# Patient Record
Sex: Male | Born: 2005 | Race: Black or African American | Hispanic: No | Marital: Single | Smoking: Never smoker
Health system: Southern US, Community
[De-identification: ages and names within clinical notes are randomized; demographics above are authoritative.]

## PROBLEM LIST (undated history)

## (undated) DIAGNOSIS — J45909 Unspecified asthma, uncomplicated: Secondary | ICD-10-CM

---
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
## 2023-09-13 IMAGING — DX DG KNEE AP/LAT W/ SUNRISE*L*
3 series · 3 of 3 positions shown · non-contrast
Comparison: None.

CLINICAL DATA: Left knee pain creasing over the past week, initial
encounter

EXAM:
LEFT KNEE 3 VIEWS

[knee ap]
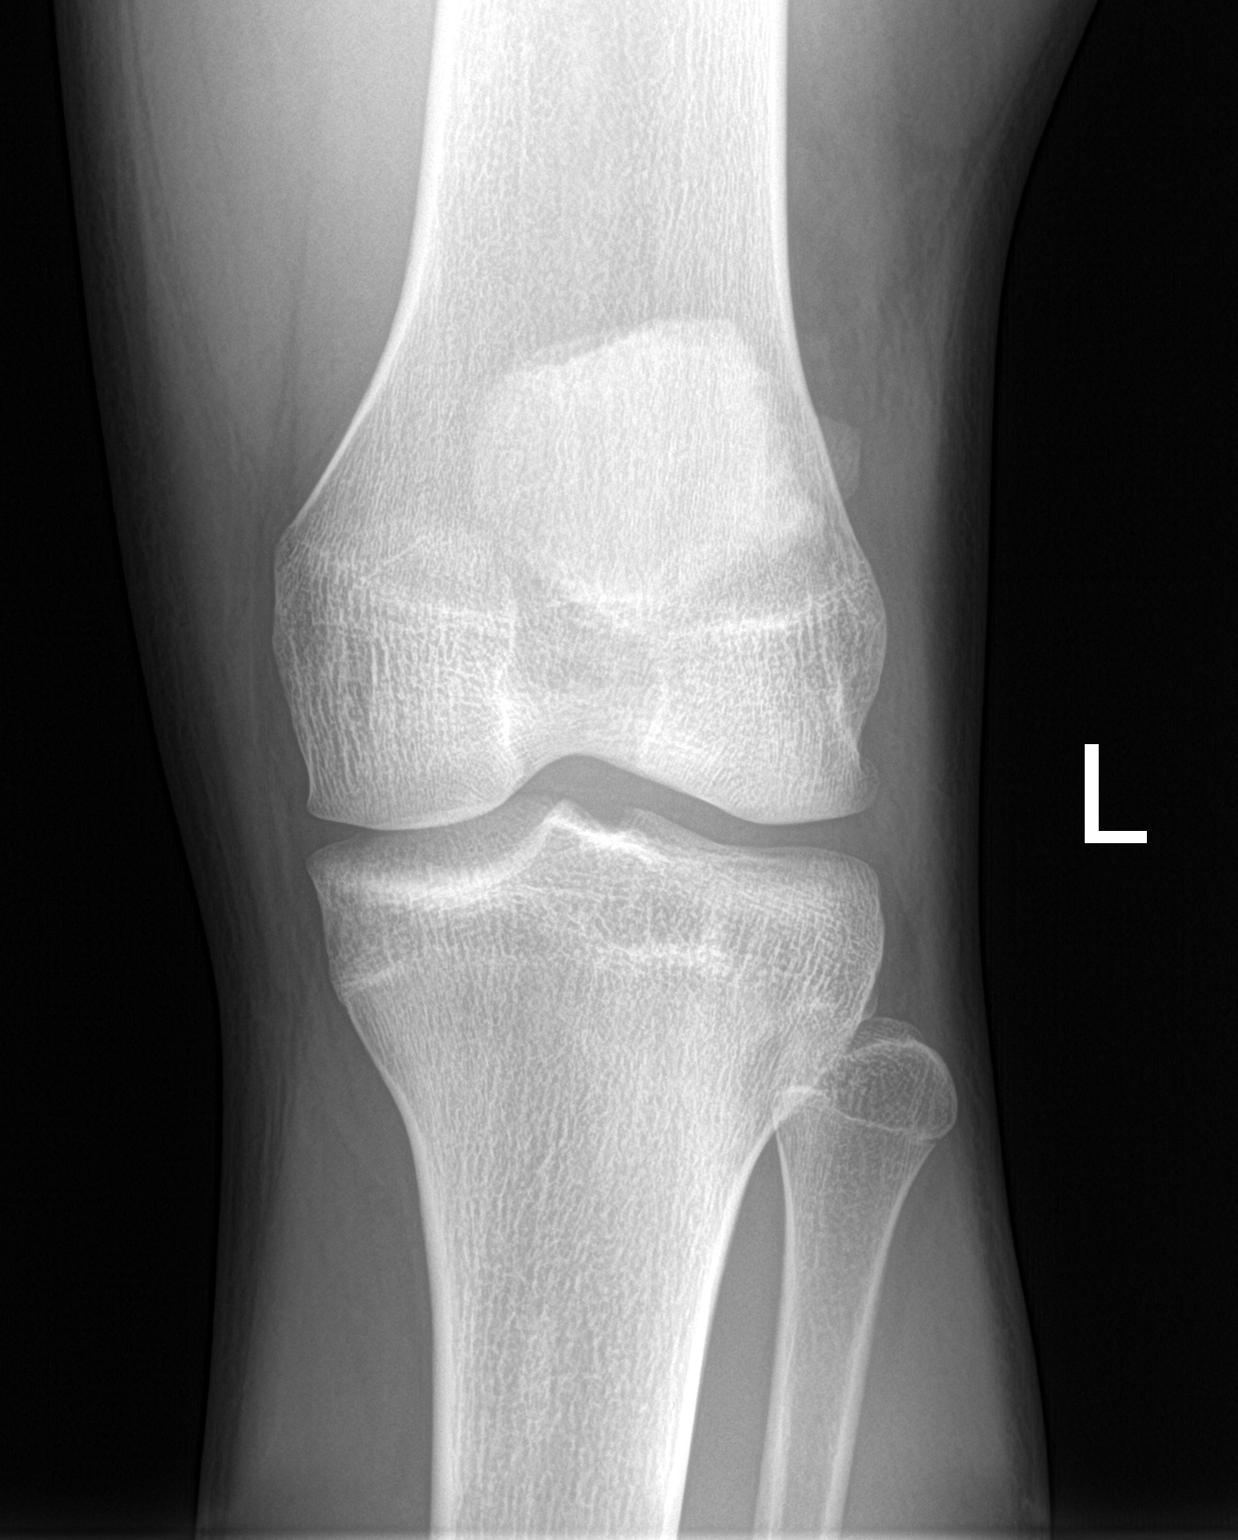

[knee lat]
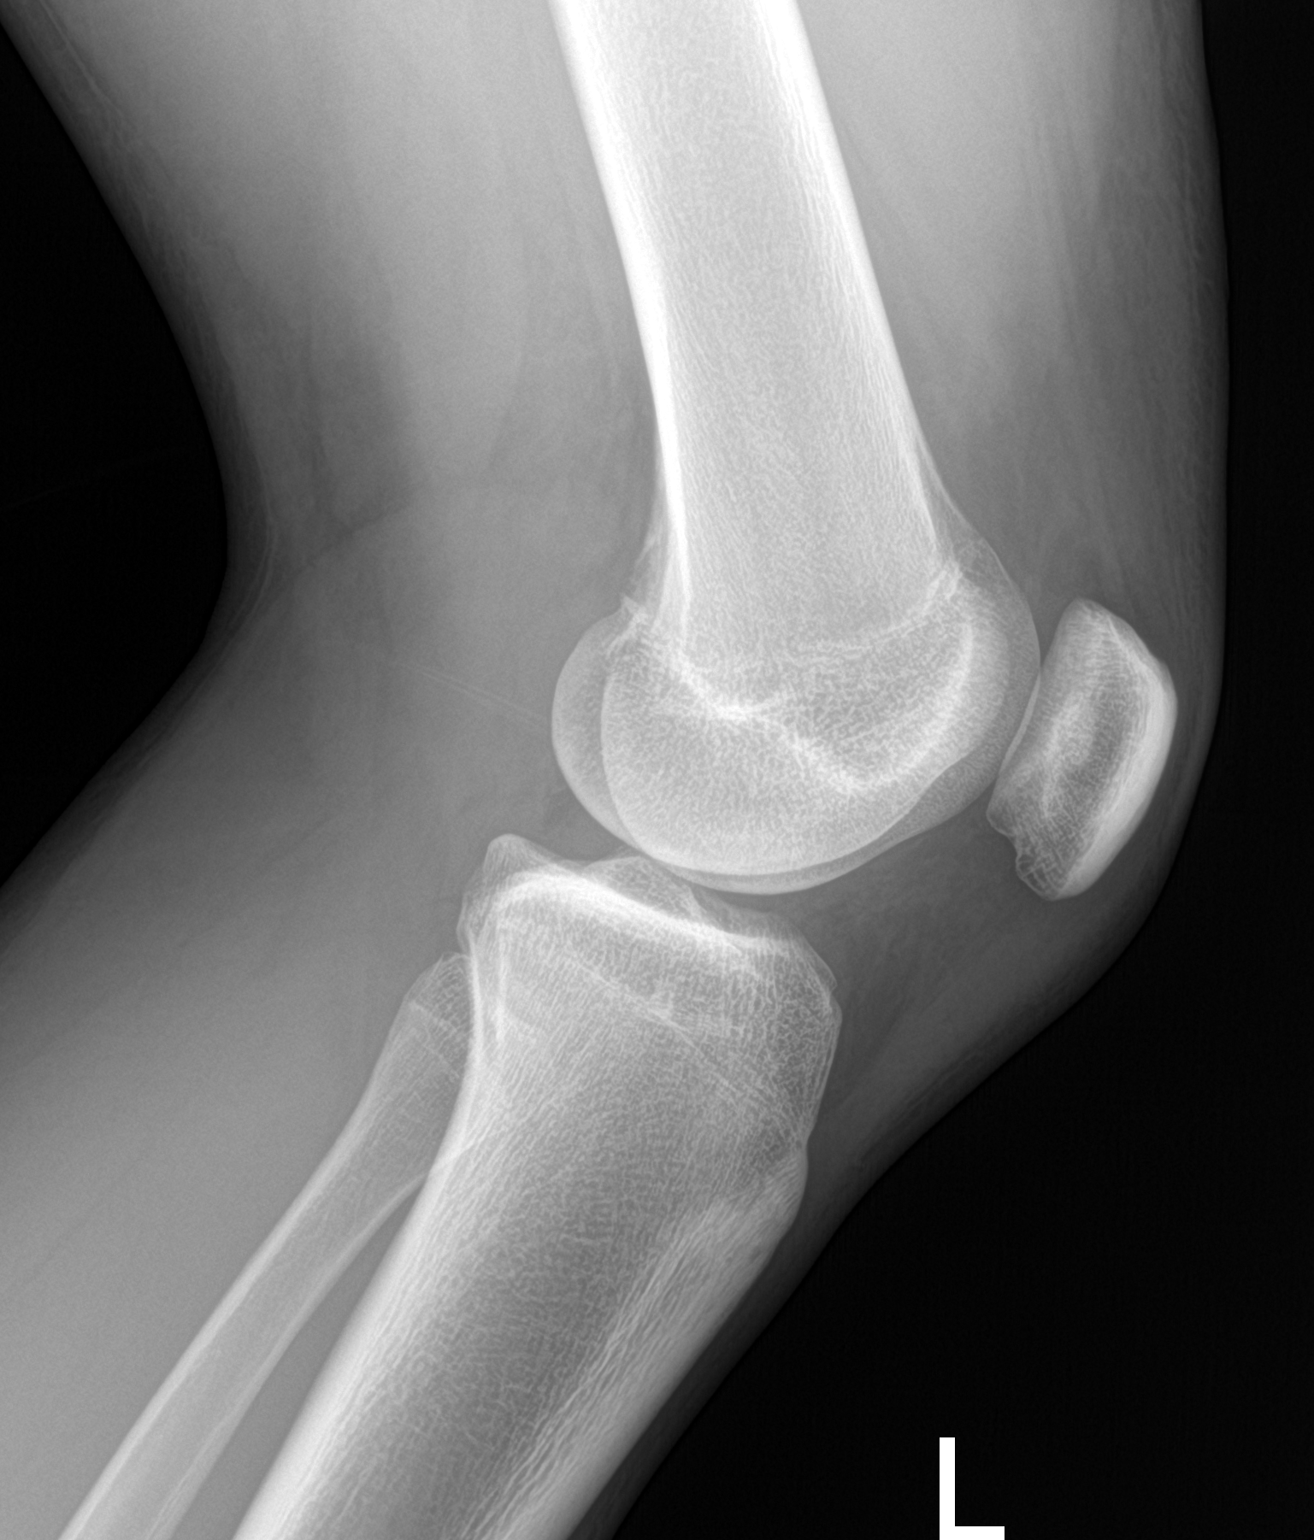

[knee sunrise]
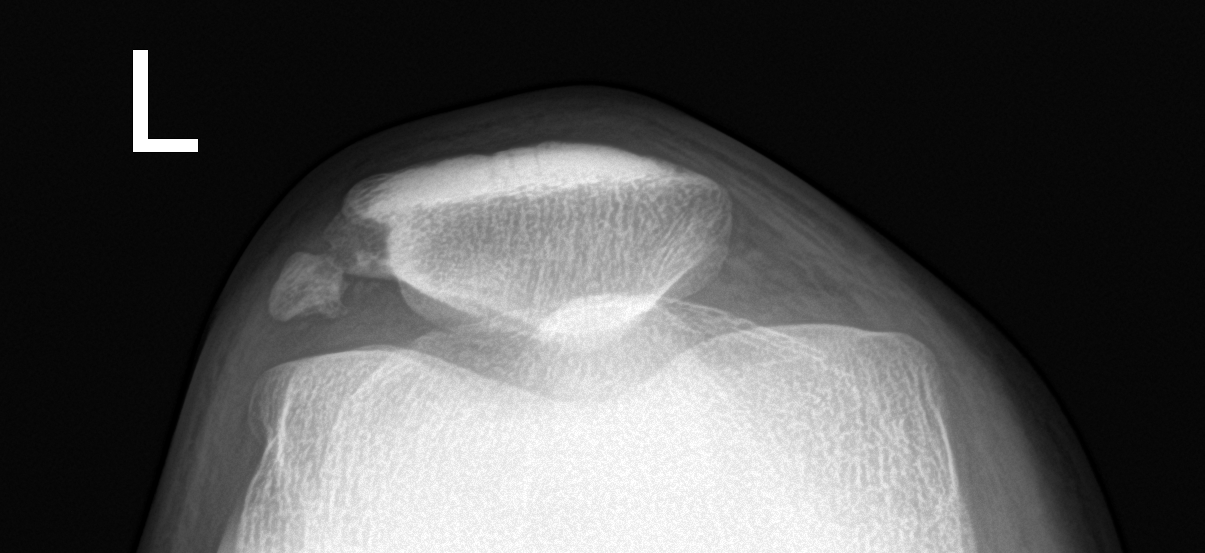

[3 of 3 positions shown; findings below may reference images not displayed]

FINDINGS: No acute fracture or dislocation is noted. Bipartite patella is
noted with separation of the smaller fragment. This may be related
to the patient's underlying pain.
IMPRESSION: Apparent bipartite patella with separation of the smaller fragment.
This may contribute to the patient's discomfort. No acute fractures
noted.

## 2023-09-30 ENCOUNTER — Encounter: Payer: Medicaid Other | Admitting: Family Medicine

## 2023-09-30 ENCOUNTER — Ambulatory Visit: Payer: Medicaid Other

## 2023-11-17 ENCOUNTER — Ambulatory Visit (HOSPITAL_COMMUNITY)
Admission: EM | Admit: 2023-11-17 | Discharge: 2023-11-18 | Disposition: A | Discharge status: Home / Self Care | Payer: Medicaid Other | Attending: Psychiatry | Admitting: Psychiatry

## 2023-11-17 DIAGNOSIS — Z62898 Other specified problems related to upbringing: Secondary | ICD-10-CM | POA: Insufficient documentation

## 2023-11-17 DIAGNOSIS — F4323 Adjustment disorder with mixed anxiety and depressed mood: Secondary | ICD-10-CM | POA: Diagnosis not present

## 2023-11-17 DIAGNOSIS — Z6332 Other absence of family member: Secondary | ICD-10-CM | POA: Insufficient documentation

## 2023-11-17 DIAGNOSIS — I498 Other specified cardiac arrhythmias: Secondary | ICD-10-CM | POA: Insufficient documentation

## 2023-11-17 LAB — CBC WITH DIFFERENTIAL/PLATELET
Abs Immature Granulocytes: 0.01 10*3/uL (ref 0.00–0.07)
Basophils Absolute: 0 10*3/uL (ref 0.0–0.1)
Basophils Relative: 1 %
Eosinophils Absolute: 0 10*3/uL (ref 0.0–0.5)
Eosinophils Relative: 1 %
HCT: 47.9 % (ref 39.0–52.0)
Hemoglobin: 17.2 g/dL — ABNORMAL HIGH (ref 13.0–17.0)
Immature Granulocytes: 0 %
Lymphocytes Relative: 40 %
Lymphs Abs: 1.8 10*3/uL (ref 0.7–4.0)
MCH: 29.5 pg (ref 26.0–34.0)
MCHC: 35.9 g/dL (ref 30.0–36.0)
MCV: 82 fL (ref 80.0–100.0)
Monocytes Absolute: 0.2 10*3/uL (ref 0.1–1.0)
Monocytes Relative: 6 %
Neutro Abs: 2.3 10*3/uL (ref 1.7–7.7)
Neutrophils Relative %: 52 %
Platelets: 297 10*3/uL (ref 150–400)
RBC: 5.84 MIL/uL — ABNORMAL HIGH (ref 4.22–5.81)
RDW: 12.9 % (ref 11.5–15.5)
WBC: 4.4 10*3/uL (ref 4.0–10.5)
nRBC: 0 % (ref 0.0–0.2)

## 2023-11-17 LAB — COMPREHENSIVE METABOLIC PANEL
ALT: 17 U/L (ref 0–44)
AST: 24 U/L (ref 15–41)
Albumin: 5 g/dL (ref 3.5–5.0)
Alkaline Phosphatase: 65 U/L (ref 38–126)
Anion gap: 9 (ref 5–15)
BUN: 17 mg/dL (ref 6–20)
CO2: 29 mmol/L (ref 22–32)
Calcium: 10.7 mg/dL — ABNORMAL HIGH (ref 8.9–10.3)
Chloride: 102 mmol/L (ref 98–111)
Creatinine, Ser: 1.19 mg/dL (ref 0.61–1.24)
GFR, Estimated: >60 mL/min (ref >60)
Glucose, Bld: 96 mg/dL (ref 70–99)
Potassium: 5 mmol/L (ref 3.5–5.1)
Sodium: 140 mmol/L (ref 135–145)
Total Bilirubin: 1.3 mg/dL — ABNORMAL HIGH (ref <1.2)
Total Protein: 8.5 g/dL — ABNORMAL HIGH (ref 6.5–8.1)

## 2023-11-17 LAB — POCT URINE DRUG SCREEN - MANUAL ENTRY (I-SCREEN)
POC Amphetamine UR: NOT DETECTED
POC Buprenorphine (BUP): NOT DETECTED
POC Cocaine UR: NOT DETECTED
POC Marijuana UR: POSITIVE — AB
POC Methadone UR: NOT DETECTED
POC Methamphetamine UR: NOT DETECTED
POC Morphine: NOT DETECTED
POC Oxazepam (BZO): NOT DETECTED
POC Oxycodone UR: NOT DETECTED
POC Secobarbital (BAR): NOT DETECTED

## 2023-11-17 LAB — HEMOGLOBIN A1C
Hgb A1c MFr Bld: 5.3 % (ref 4.8–5.6)
Mean Plasma Glucose: 105 mg/dL

## 2023-11-17 LAB — LIPID PANEL
Cholesterol: 194 mg/dL (ref 0–200)
HDL: 78 mg/dL (ref >40)
LDL Cholesterol: 107 mg/dL — ABNORMAL HIGH (ref 0–99)
Total CHOL/HDL Ratio: 2.5 {ratio}
Triglycerides: 44 mg/dL (ref <150)
VLDL: 9 mg/dL (ref 0–40)

## 2023-11-17 LAB — TSH: TSH: 1.755 u[IU]/mL (ref 0.350–4.500)

## 2023-11-17 LAB — ETHANOL: Alcohol, Ethyl (B): <10 mg/dL (ref <10)

## 2023-11-17 MED ORDER — ALUM & MAG HYDROXIDE-SIMETH 200-200-20 MG/5ML PO SUSP: 30.0000 mL | ORAL | Status: DC | PRN

## 2023-11-17 MED ORDER — HYDROXYZINE HCL 25 MG PO TABS
25.0000 mg | ORAL_TABLET | Freq: Three times a day (TID) | ORAL | Status: DC | PRN
  Administered 2023-11-17 (×2): 25 mg via ORAL
  Filled 2023-11-17 (×2): qty 1

## 2023-11-17 MED ORDER — MAGNESIUM HYDROXIDE 400 MG/5ML PO SUSP: 30.0000 mL | Freq: Every day | ORAL | Status: DC | PRN

## 2023-11-17 MED ORDER — ACETAMINOPHEN 325 MG PO TABS: 650.0000 mg | ORAL_TABLET | Freq: Four times a day (QID) | ORAL | Status: DC | PRN

## 2023-11-17 MED ORDER — TRAZODONE HCL 50 MG PO TABS
50.0000 mg | ORAL_TABLET | Freq: Every evening | ORAL | Status: DC | PRN
  Administered 2023-11-17: 50 mg via ORAL
  Filled 2023-11-17: qty 1
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
## 2023-11-17 NOTE — Discharge Instructions (Addendum)
Discharge recommendations:   Medications: Patient is to take medications as prescribed. The patient or patient's guardian is to contact a medical professional and/or outpatient provider to address any new side effects that develop. The patient or the patient's guardian should update outpatient providers of any new medications and/or medication changes.   Outpatient Follow up: Please review list of outpatient resources for psychiatry and counseling. Please follow up with your primary care provider for all medical related needs.   You are encouraged to follow up with [facility] for outpatient treatment.  Walk in/ Open Access Hours: Monday - Friday 8AM - 11AM (To see provider and therapist) Friday - 1PM - 4PM (To see therapist only)  [facility] [address] [place], [place] [phone]  Therapy: We recommend that patient participate in individual therapy to address mental health concerns.  Safety:   The following safety precautions should be taken:   No sharp objects. This includes scissors, razors, scrapers, and putty knives.   Chemicals should be removed and locked up.   Medications should be removed and locked up.   Weapons should be removed and locked up. This includes firearms, knives and instruments that can be used to cause injury.   The patient should abstain from use of illicit substances/drugs and abuse of any medications.  If symptoms worsen or do not continue to improve or if the patient becomes actively suicidal or homicidal then it is recommended that the patient return to the closest hospital emergency department, the [facility], or call 911 for further evaluation and treatment. National Suicide Prevention Lifeline 1-800-SUICIDE or 1-800-273-8255.  About 988 988 offers 24/7 access to trained crisis counselors who can help people experiencing mental health-related distress. People can call or  text 988 or chat 988lifeline.org for themselves or if they are worried about a loved one who may need crisis support.     

## 2023-11-17 NOTE — ED Notes (Signed)
Patient observed/assessed in bed/chair resting quietly appearing in no distress and verbalizing no complaints at this time. Will continue to monitor.  

## 2023-11-17 NOTE — Progress Notes (Signed)
   11/17/23 0944  [facility] Triage Screening (Walk-ins at [facility] only)  How Did You Hear About [place]? Self  What Is the Reason for Your Visit/Call Today? Pt presents to [facility] voluntarily unaccompanied. Pt states that he recently was in a relationship and they decided to take a break. Pt states he has been dealing with a lot of past issues and he noticed that his love for basketball is starting to fade. Pt states that he lives with his grandmother, and doesn't really have anyone because his father is incarcerated and he doesn't know where his mother is at.  How Long Has This Been Causing You Problems? > than 6 months  Have You Recently Had Any Thoughts About Hurting Yourself? Yes  How long ago did you have thoughts about hurting yourself? yesterday - no plan  Are You Planning to Commit Suicide/Harm Yourself At This time? No  Have you Recently Had Thoughts About Hurting Someone [name]? No  Are You Planning To Harm Someone At This Time? No  Physical Abuse Denies  Verbal Abuse Denies  Sexual Abuse Denies  Exploitation of patient/patient's resources Denies  Self-Neglect Denies  Are you currently experiencing any auditory, visual or other hallucinations? No  Have You Used Any Alcohol or Drugs in the Past 24 Hours? Yes  How long ago did you use Drugs or Alcohol? yesterday  What Did You Use and How Much? THC pen  Do you have any current medical co-morbidities that require immediate attention? No  Clinician description of patient physical appearance/behavior: casually dressed, calm, cooperative, soft spoken  What Do You Feel Would Help You the Most Today? Social Support;Treatment for Depression or other mood problem;Medication(s)  If access to [facility] Urgent Care was not available, would you have sought care in the Emergency Department? No  Determination of Need Routine (7 days)  Options For Referral Medication Management;Intensive Outpatient Therapy;Outpatient Therapy

## 2023-11-17 NOTE — ED Provider Notes (Signed)
[facility] Continuous Assessment Admission H&P  Date: 11/17/23 Patient Name: Jesse Hart MRN: [id number] Chief Complaint: Depressed mood after break up.   Diagnoses:  Final diagnoses:  Adjustment disorder with mixed anxiety and depressed mood    HPI: Jesse Hart 17 yr old male pt with no past psychiatric history who presented to [facility] for depressed mood and anxiety with passive SI after a break up with girlfriend of almost 2 years. He reports "I have a lot going on mentally. Things that have happened in my life that I never talked about, have caused a lot of pain and anger. I think I need some help now." Pt complains of loss of interest in activities such as basketball, decreased appetite and sleep, sadness, isolation, hopelessness and crying. These symptoms have worsened over the past 1-2 months and started when he was in the 8th grade.  He has not received any any prior outpatient mental health services. He reports having trouble opening up to others about his past and feels that others do not understand. He reports past trauma of witnessing his grandfather get tazed,  grandparents fighting a lot, and father being involved in gang violence then getting incarcerated. His mother and father have not been present in his life since he was a young child. He reports that thinking about his past caused a lot of anxiety yesterday and endorses using a THC pen for the first time to calm himself down. Denies homicidal ideations and hallucinations. Denies history of suicide attempts or self injurious behaviors.He does not feel that grandmother is supportive of him getting mental health treatment and states "she thinks I am doing too much". Pt denies active SI today but endorses passive SI without plan or intent yesterday stating "I think it would be better if I was not around".   Pt is unable to contract for safety initially when discussing option of inpatient treatment but then stated that he is too  scared and does not want to harm himself. He also brings up the option of staying with a friend instead of going back to his grandmother's house because he is easily triggered there. He states that he can keep himself safe when staying with his friend. His support system includes his ex-girlfriend and three close friends- Jean Rosenthal, [place] and [place].    Total Time spent with patient: 45 minutes  Musculoskeletal  Strength & Muscle Tone: within normal limits Gait & Station: normal Patient leans: N/A  Psychiatric Specialty Exam  Presentation General Appearance:  Appropriate for Environment  Eye Contact: Fair  Speech: Normal Rate  Speech Volume: Normal  Handedness: Right   Mood and Affect  Mood: Depressed; Hopeless  Affect: Depressed   Thought Process  Thought Processes: Coherent  Descriptions of Associations:Intact  Orientation:Full (Time, Place and Person)  Thought Content:WDL    Hallucinations:Hallucinations: None  Ideas of Reference:None  Suicidal Thoughts:Suicidal Thoughts: Yes, Passive SI Passive Intent and/or Plan: Without Intent; Without Plan  Homicidal Thoughts:Homicidal Thoughts: No   Sensorium  Memory: Immediate Good; Remote Good; Recent Good  Judgment: Fair  Insight: Fair   Art therapist  Concentration: Fair  Attention Span: Good  Recall: Good  Fund of Knowledge: Good  Language: Good   Psychomotor Activity  Psychomotor Activity: Psychomotor Activity: Normal   Assets  Assets: Desire for Improvement; Social Support; Housing; Talents/Skills; Physical Health; Vocational/Educational   Sleep  Sleep: Sleep: Fair Number of Hours of Sleep: 6   Nutritional Assessment (For OBS and FBC admissions only)  Has the patient had a weight loss or gain of 10 pounds or more in the last 3 months?: No Has the patient had a decrease in food intake/or appetite?: Yes Does the patient have dental problems?: No Does the patient  have eating habits or behaviors that may be indicators of an eating disorder including binging or inducing vomiting?: No Has the patient recently lost weight without trying?: 0 Has the patient been eating poorly because of a decreased appetite?: 1 Malnutrition Screening Tool Score: 1    Physical Exam Constitutional:      Appearance: Normal appearance.  HENT:     Head: Normocephalic.     Nose: Nose normal.  Eyes:     Pupils: Pupils are equal, round, and reactive to light.  Cardiovascular:     Rate and Rhythm: Normal rate.  Pulmonary:     Effort: Pulmonary effort is normal.  Musculoskeletal:        General: Normal range of motion.     Cervical back: Normal range of motion.  Neurological:     Mental Status: He is alert and oriented to person, place, and time.    Review of Systems  Constitutional: Negative.   HENT: Negative.    Eyes: Negative.   Respiratory: Negative.    Cardiovascular: Negative.   Gastrointestinal: Negative.   Genitourinary: Negative.   Musculoskeletal: Negative.   Neurological: Negative.   Endo/Heme/Allergies: Negative.   Psychiatric/Behavioral:  Positive for depression. The patient is nervous/anxious.     Blood pressure (!) 149/89, pulse 70, temperature 98.4 F (36.9 C), temperature source Oral, resp. rate 16, SpO2 100%. There is no height or weight on file to calculate BMI.  Past Psychiatric History: No psychiatric history.    Is the patient at risk to self? Yes  Has the patient been a risk to self in the past 6 months? No .    Has the patient been a risk to self within the distant past? No   Is the patient a risk to others? No   Has the patient been a risk to others in the past 6 months? No   Has the patient been a risk to others within the distant past? No   Past Medical History: None  Family History: No family history reported   Social History: Pt [place] currently in the 12th grade at [employer] He states that he is graduating  next year with plans to go to college. Currently living with grandmother. He used a THC pen yesterday for the first time.   Last Labs:  Admission on 11/17/2023  Component Date Value Ref Range Status   POC Amphetamine UR 11/17/2023 None Detected  NONE DETECTED (Cut Off Level 1000 ng/mL) Final   POC Secobarbital (BAR) 11/17/2023 None Detected  NONE DETECTED (Cut Off Level 300 ng/mL) Final   POC Buprenorphine (BUP) 11/17/2023 None Detected  NONE DETECTED (Cut Off Level 10 ng/mL) Final   POC Oxazepam (BZO) 11/17/2023 None Detected  NONE DETECTED (Cut Off Level 300 ng/mL) Final   POC Cocaine UR 11/17/2023 None Detected  NONE DETECTED (Cut Off Level 300 ng/mL) Final   POC Methamphetamine UR 11/17/2023 None Detected  NONE DETECTED (Cut Off Level 1000 ng/mL) Final   POC Morphine 11/17/2023 None Detected  NONE DETECTED (Cut Off Level 300 ng/mL) Final   POC Methadone UR 11/17/2023 None Detected  NONE DETECTED (Cut Off Level 300 ng/mL) Final   POC Oxycodone UR 11/17/2023 None Detected  NONE DETECTED (Cut Off Level 100  ng/mL) Final   POC Marijuana UR 11/17/2023 Positive (A)  NONE DETECTED (Cut Off Level 50 ng/mL) Final    Allergies: Patient has no known allergies.  Medications:  Facility Ordered Medications  Medication   acetaminophen (TYLENOL) tablet 650 mg   alum & mag hydroxide-simeth (MAALOX/MYLANTA) 200-200-20 MG/5ML suspension 30 mL   magnesium hydroxide (MILK OF MAGNESIA) suspension 30 mL   hydrOXYzine (ATARAX) tablet 25 mg   traZODone (DESYREL) tablet 50 mg   PTA Medications  Medication Sig   albuterol (VENTOLIN HFA) 108 (90 Base) MCG/ACT inhaler Inhale 2 puffs into the lungs every 6 (six) hours as needed for wheezing or shortness of breath.      Medical Decision Making  Pt admitted to [facility] continued assessment for overnight observation for safety and mood stabilization.    Lab Orders         CBC with Differential/Platelet         Comprehensive metabolic panel          Hemoglobin A1c         Ethanol         Lipid panel         TSH         POCT Urine Drug Screen - (I-Screen)    EKG  Meds ordered this encounter  Medications   acetaminophen (TYLENOL) tablet 650 mg   alum & mag hydroxide-simeth (MAALOX/MYLANTA) 200-200-20 MG/5ML suspension 30 mL   magnesium hydroxide (MILK OF MAGNESIA) suspension 30 mL   hydrOXYzine (ATARAX) tablet 25 mg   traZODone (DESYREL) tablet 50 mg       Recommendations  Based on my evaluation the patient does not appear to have an emergency medical condition.  Howie Ill, NP 11/17/23  11:43 AM

## 2023-11-17 NOTE — ED Notes (Signed)
Patient is alert and oriented X 4. He is calm and cooperative. He denies current SI/HI or AVH. He states yesterday he did have thoughts of harming himself but with no specific plan or intent. Patient contracted for safety. Skin check conducted by this nurse and [facility], MHT. Pt oriented to the unit and offered food and beverage. He denies physical pain or discomfort. No additional needs noted at this time. We will continue to monitor for safety.

## 2023-11-18 MED ORDER — HYDROXYZINE HCL 25 MG PO TABS: 25.0000 mg | ORAL_TABLET | Freq: Three times a day (TID) | ORAL | 0 refills | Status: DC | PRN

## 2023-11-18 MED ORDER — TRAZODONE HCL 50 MG PO TABS: 50.0000 mg | ORAL_TABLET | Freq: Every evening | ORAL | 0 refills | Status: DC | PRN

## 2023-11-18 NOTE — ED Notes (Signed)

## 2023-11-18 NOTE — ED Notes (Signed)
Patient discharged in no acute distress. Pt denies SI/HI or AVH at the time of discharge. Patient's belongings returned from locker # 27. Patient escorted to the front lobby by staff for transport home with grandparents. AVS given and reviewed with patient and grandparents. Understanding voiced. Safety maintained.

## 2023-11-18 NOTE — ED Notes (Signed)
Pt sleeping at present, respirations even & unlabored, no distress noted.  Monitoring for safety.

## 2023-11-18 NOTE — ED Provider Notes (Signed)
FBC/OBS ASAP Discharge Summary  Date and Time: 11/18/2023 8:21 AM  Name: Jesse Hart  MRN:  [id number]   Discharge Diagnoses:  Final diagnoses:  Adjustment disorder with mixed anxiety and depressed mood    Subjective: Jesse Hart is a 17 year old male that presented for passive SI in the context of recent break up with girlfriend. Upon assessment today, he is awake, alert and oriented x3 , and seen talking on the phone with ex-girlfriend in milieu. He is calm and cooperative with euthymic mood. He denies current suicidal ideations, homicidal ideations and hallucinations. He denies feeling anxious and reports sleeping well last night. He states that the Trazodone and Hydroxyzine were effective for sleep and anxiety. Denies any side effects or concerns with these medications. Reports feeling "much better just getting away from everything for the night and I am actually excited to get back to playing ball and starting therapy". He reports speaking with his grandmother yesterday and feels that she was understanding and supportive of him wanting mental health treatment. He also spoke with ex-girlfriend who is also in support. He is able to contract for safety stating "if I get overwhelmed or stressed, I can call my old basketball coach Abner Greenspan or reach out to my teacher Ms. Alexander who has helped me talk through things before". He denies any access to weapons at home. We discussed plan to discharge and follow up with psychiatry and therapy through [facility]. Pt verbalizes understanding. Discussed plan of care with Dr. Enedina Finner.   Consent was given by pt to speak with grandparents for safety planning. This provider spoke with Grandmother Sherrin Daisy) and Grandfather Norva Riffle) in the triage room. They were obviously concerned about his mental health and very supportive of him getting treatment. Safety planning was discussed- no weapons in the home and monitor or lock up his medications. They  denied any safety concerns at this time.   Stay Summary: Jesse Hart 17 yr old male pt with no past psychiatric history who presented to [facility] 11/17/23 for depressed mood and anxiety with passive SI after a break up with girlfriend of almost 2 years. Pt complains of loss of interest in activities, decreased appetite and sleep, sadness, isolation, hopelessness and crying. These symptoms have worsened over the past 1-2 months and started when he was in the 8th grade.  Pt denied active SI yesterday but endorsed passive SI without plan or intent yesterday stating "I think it would be better if I was not around". He has not received any any prior outpatient mental health services. He was admitted to [facility] continuous assessment for stabilization and monitoring.   Total Time spent with patient: 30 minutes  Past Psychiatric History: No past psychiatric history reported.  Past Medical History: No medical history reported.  Family History: No family medical history reported.  Family Psychiatric History: No family psychiatric history reported.  Social History: Pt is currently living with grandmother and is in 12th grade at [employer] where he also plays basketball. Reports using a THC pen 2 days ago for the first time but does not plan to use it again.  Tobacco Cessation:  N/A, patient does not currently use tobacco products  Current Medications:  Current Facility-Administered Medications  Medication Dose Route Frequency Provider Last Rate Last Admin   acetaminophen (TYLENOL) tablet 650 mg  650 mg Oral Q6H PRN Howie Ill, NP       alum & mag hydroxide-simeth (MAALOX/MYLANTA) 200-200-20 MG/5ML suspension  30 mL  30 mL Oral Q4H PRN Howie Ill, NP       hydrOXYzine (ATARAX) tablet 25 mg  25 mg Oral TID PRN Howie Ill, NP   25 mg at 11/17/23 2110   magnesium hydroxide (MILK OF MAGNESIA) suspension 30 mL  30 mL Oral Daily PRN Howie Ill, NP       traZODone (DESYREL)  tablet 50 mg  50 mg Oral QHS PRN Howie Ill, NP   50 mg at 11/17/23 2111   Current Outpatient Medications  Medication Sig Dispense Refill   albuterol (VENTOLIN HFA) 108 (90 Base) MCG/ACT inhaler Inhale 2 puffs into the lungs every 6 (six) hours as needed for wheezing or shortness of breath. 6.7 g 0   ASHWAGANDHA PO Take 2 tablets by mouth daily.      PTA Medications:  Facility Ordered Medications  Medication   acetaminophen (TYLENOL) tablet 650 mg   alum & mag hydroxide-simeth (MAALOX/MYLANTA) 200-200-20 MG/5ML suspension 30 mL   magnesium hydroxide (MILK OF MAGNESIA) suspension 30 mL   hydrOXYzine (ATARAX) tablet 25 mg   traZODone (DESYREL) tablet 50 mg   PTA Medications  Medication Sig   albuterol (VENTOLIN HFA) 108 (90 Base) MCG/ACT inhaler Inhale 2 puffs into the lungs every 6 (six) hours as needed for wheezing or shortness of breath.       09/16/2021   10:43 AM 12/03/2019    9:20 AM 09/04/2019    9:43 AM  Depression screen PHQ 2/9  Decreased Interest 0 0 0  Down, Depressed, Hopeless 0 0 0  PHQ - 2 Score 0 0 0  Altered sleeping 0    Tired, decreased energy 0    Change in appetite 0    Feeling bad or failure about yourself  0    Trouble concentrating 0    Moving slowly or fidgety/restless 0    Suicidal thoughts 0    PHQ-9 Score 0    Difficult doing work/chores Not difficult at all      Flowsheet Row ED from 11/17/2023 in [facility] ED from 11/11/2022 in [facility] at [facility] from 09/14/2022 in [facility] at [facility] RISK CATEGORY Low Risk No Risk No Risk       Musculoskeletal  Strength & Muscle Tone: within normal limits Gait & Station: normal Patient leans: N/A  Psychiatric Specialty Exam  Presentation  General Appearance:  Appropriate for Environment  Eye Contact: Good  Speech: Clear and Coherent; Normal Rate  Speech Volume: Normal  Handedness: Right   Mood and  Affect  Mood: Euthymic  Affect: Appropriate   Thought Process  Thought Processes: Coherent  Descriptions of Associations:Intact  Orientation:Full (Time, Place and Person)  Thought Content:WDL  Diagnosis of Schizophrenia or Schizoaffective disorder in past: No    Hallucinations:Hallucinations: None  Ideas of Reference:None  Suicidal Thoughts: Denies current SI.  Homicidal Thoughts:Homicidal Thoughts: No   Sensorium  Memory: Immediate Good; Recent Good; Remote Good  Judgment: Good  Insight: Good   Executive Functions  Concentration: Good  Attention Span: Good  Recall: Good  Fund of Knowledge: Good  Language: Good   Psychomotor Activity  Psychomotor Activity: Psychomotor Activity: Normal   Assets  Assets: Desire for Improvement; Housing; Physical Health; Social Support; Talents/Skills; Vocational/Educational   Sleep  Sleep: Sleep: Good Number of Hours of Sleep: 8   Nutritional Assessment (For OBS and FBC admissions only) Has the patient had a weight  loss or gain of 10 pounds or more in the last 3 months?: No Has the patient had a decrease in food intake/or appetite?: Yes Does the patient have dental problems?: No Does the patient have eating habits or behaviors that may be indicators of an eating disorder including binging or inducing vomiting?: No Has the patient recently lost weight without trying?: 0 Has the patient been eating poorly because of a decreased appetite?: 1 Malnutrition Screening Tool Score: 1    Physical Exam  Physical Exam Vitals reviewed.  Constitutional:      Appearance: Normal appearance.  HENT:     Head: Normocephalic.     Nose: Nose normal.  Eyes:     Pupils: Pupils are equal, round, and reactive to light.  Cardiovascular:     Rate and Rhythm: Normal rate.  Pulmonary:     Effort: Pulmonary effort is normal.  Musculoskeletal:        General: Normal range of motion.     Cervical back: Normal range of  motion.  Skin:    General: Skin is warm.  Neurological:     General: No focal deficit present.     Mental Status: He is alert.    Review of Systems  Constitutional: Negative.   HENT: Negative.    Eyes: Negative.   Respiratory: Negative.    Cardiovascular: Negative.   Gastrointestinal: Negative.   Genitourinary: Negative.   Musculoskeletal: Negative.   Neurological: Negative.   Endo/Heme/Allergies: Negative.   Psychiatric/Behavioral:  Positive for depression.    Blood pressure 137/77, pulse 60, temperature 98 F (36.7 C), temperature source Oral, resp. rate 18, SpO2 100%. There is no height or weight on file to calculate BMI.  Demographic Factors:  Male and Adolescent or young adult  Loss Factors: NA  Historical Factors: NA  Risk Reduction Factors:   Sense of responsibility to family, Religious beliefs about death, Living with another person, especially a relative, Positive social support, Positive therapeutic relationship, and Positive coping skills or problem solving skills  Continued Clinical Symptoms:  Depression:   Hopelessness  Cognitive Features That Contribute To Risk:  None    Suicide Risk:  Minimal: No identifiable suicidal ideation.  Patients presenting with no risk factors but with morbid ruminations; may be classified as minimal risk based on the severity of the depressive symptoms  Plan Of Care/Follow-up recommendations:  Medications: Patient is to take medications as prescribed. The patient or patient's guardian is to contact a medical professional and/or outpatient provider to address any new side effects that develop. The patient or the patient's guardian should update outpatient providers of any new medications and/or medication changes.   Will E-Prescribe Vistaril 25mg  TID PRN for anxiety x 14 tablets.  Will E-Prescribe Trazodone 50mg  at bedtime PRN for sleep x 14 tablets.    Outpatient Follow up: Please review list of outpatient resources for  psychiatry and counseling. Please follow up with your primary care provider for all medical related needs.  You are encouraged to follow up with [facility] for outpatient treatment.  Walk in/ Open [facility] Hours: Monday - Friday 8AM - 11AM (To see provider and therapist) Friday - 1PM - 4PM (To see therapist only)  [facility] [address] [place], [place] [phone]  Therapy: We recommend that patient participate in individual therapy to address mental health concerns.   Safety:   The following safety precautions should be taken:   No sharp objects. This includes scissors, razors, scrapers, and putty  knives.   Chemicals should be removed and locked up.   Medications should be removed and locked up.   Weapons should be removed and locked up. This includes firearms, knives and instruments that can be used to cause injury.   The patient should abstain from use of illicit substances/drugs and abuse of any medications.  If symptoms worsen or do not continue to improve or if the patient becomes actively suicidal or homicidal then it is recommended that the patient return to the closest hospital emergency department, the [facility], or call 911 for further evaluation and treatment. National Suicide Prevention Lifeline 1-800-SUICIDE or (678) 198-3185.  About 988 988 offers 24/7 access to trained crisis counselors who can help people experiencing mental health-related distress. People can call or text 988 or chat 988lifeline.org for themselves or if they are worried about a loved one who may need crisis support.     Disposition: Pt will discharge home and follow up with [facility] outpatient services. Discussed plan of care with Dr. Enedina Finner.  Howie Ill, NP 11/18/2023, 8:21 AM

## 2023-11-30 ENCOUNTER — Encounter (HOSPITAL_COMMUNITY): Payer: Self-pay

## 2023-11-30 ENCOUNTER — Ambulatory Visit (HOSPITAL_COMMUNITY)
Admission: EM | Admit: 2023-11-30 | Discharge: 2023-11-30 | Disposition: A | Discharge status: Home / Self Care | Payer: Medicaid Other | Attending: Emergency Medicine | Admitting: Emergency Medicine

## 2023-11-30 DIAGNOSIS — S39012A Strain of muscle, fascia and tendon of lower back, initial encounter: Secondary | ICD-10-CM

## 2023-11-30 MED ORDER — TRIAMCINOLONE ACETONIDE 40 MG/ML IJ SUSP
40.0000 mg | Freq: Once | INTRAMUSCULAR | Status: AC
  Administered 2023-11-30: 40 mg via INTRAMUSCULAR

## 2023-11-30 MED ORDER — TRIAMCINOLONE ACETONIDE 40 MG/ML IJ SUSP
INTRAMUSCULAR | Status: AC
  Filled 2023-11-30: qty 1

## 2023-11-30 NOTE — Discharge Instructions (Signed)
 Your exam is consistent with a lumbar strain.  Please rest your muscles and avoid heavy lifting or sports until next week.  If your back pain persists despite rest, steroid injection and anti-inflammatories please follow-up with your orthopedic team.  Return to clinic for any new or urgent symptoms.

## 2023-11-30 NOTE — ED Provider Notes (Signed)
 [facility]    CSN: [id number] Arrival date & time: 11/30/23  1115      History   Chief Complaint Chief Complaint  Patient presents with   Back Pain    HPI Jesse Hart is a 18 y.o. male.   Patient reports back pain that started during a basketball game on December 27.  He was able to continue playing.  Practice the next day he had a lot of pain and had to get stretched out.  He had 4 days off and tried to go back to practice but his lower back continues to hurt.   He has been resting, using ibuprofen  and stretching with minimal relief.  Has bilateral lower back pain that is worse on his left side and moves into his left glute.  Denies any trauma or falls.  Unable to pinpoint an injury that caused his back pain.  No dysuria.   Pain increases with bending and twisting.  The history is provided by the patient and medical records.  Back Pain   Past Medical History:  Diagnosis Date   Asthma     Patient Active Problem List   Diagnosis Date Noted   Grade 3 ankle sprain, left, initial encounter 12/04/2019   Eczema 01/19/2007    History reviewed. No pertinent surgical history.     Home Medications    Prior to Admission medications   Medication Sig Start Date End Date Taking? Authorizing Provider  albuterol  (VENTOLIN  HFA) 108 (90 Base) MCG/ACT inhaler Inhale 2 puffs into the lungs every 6 (six) hours as needed for wheezing or shortness of breath. 09/14/22   Wedderburn, Ngozi N, NP  hydrOXYzine  (ATARAX ) 25 MG tablet Take 1 tablet (25 mg total) by mouth 3 (three) times daily as needed for anxiety. 11/18/23   Brent, Amanda C, NP  traZODone  (DESYREL ) 50 MG tablet Take 1 tablet (50 mg total) by mouth at bedtime as needed for sleep. 11/18/23   Thresa Alan BROCKS, NP    Family History History reviewed. No pertinent family history.  Social History Social History   Tobacco Use   Smoking status: Never    Passive exposure: Yes   Smokeless tobacco: Never  Vaping  Use   Vaping status: Never Used     Allergies   Patient has no known allergies.   Review of Systems Review of Systems   Physical Exam Triage Vital Signs ED Triage Vitals  Encounter Vitals Group     BP 11/30/23 1209 132/65     Systolic BP Percentile --      Diastolic BP Percentile --      Pulse Rate 11/30/23 1209 (!) 56     Resp 11/30/23 1209 18     Temp 11/30/23 1209 97.9 F (36.6 C)     Temp Source 11/30/23 1209 Oral     SpO2 11/30/23 1209 98 %     Weight --      Height --      Head Circumference --      Peak Flow --      Pain Score 11/30/23 1210 5     Pain Loc --      Pain Education --      Exclude from Growth Chart --    No data found.  Updated Vital Signs BP 132/65 (BP Location: Left Arm)   Pulse (!) 56   Temp 97.9 F (36.6 C) (Oral)   Resp 18   SpO2 98%   Visual Acuity Right  Eye Distance:   Left Eye Distance:   Bilateral Distance:    Right Eye Near:   Left Eye Near:    Bilateral Near:     Physical Exam Vitals and nursing note reviewed.  Constitutional:      Appearance: Normal appearance.  HENT:     Head: Normocephalic and atraumatic.     Right Ear: External ear normal.     Left Ear: External ear normal.     Nose: Nose normal.     Mouth/Throat:     Mouth: Mucous membranes are moist.  Eyes:     General: No scleral icterus. Cardiovascular:     Rate and Rhythm: Normal rate.  Pulmonary:     Effort: Pulmonary effort is normal. No respiratory distress.  Musculoskeletal:        General: No swelling, tenderness, deformity or signs of injury. Normal range of motion.     Cervical back: Normal.     Thoracic back: Normal.     Lumbar back: Negative right straight leg raise test and negative left straight leg raise test.       Back:     Comments: Bilateral muscular lower back pain.  No spinal tenderness, step-off or deformity.  Pain is elicited with bending and twisting.  Negative straight leg raise bilaterally.  Skin:    General: Skin is warm and  dry.  Neurological:     General: No focal deficit present.     Mental Status: He is alert and oriented to person, place, and time.  Psychiatric:        Mood and Affect: Mood normal.        Behavior: Behavior normal. Behavior is cooperative.      UC Treatments / Results  Labs (all labs ordered are listed, but only abnormal results are displayed) Labs Reviewed - No data to display  EKG   Radiology No results found.  Procedures Procedures (including critical care time)  Medications Ordered in UC Medications  triamcinolone  acetonide (KENALOG -40) injection 40 mg (has no administration in time range)    Initial Impression / Assessment and Plan / UC Course  I have reviewed the triage vital signs and the nursing notes.  Pertinent labs & imaging results that were available during my care of the patient were reviewed by me and considered in my medical decision making (see chart for details).  Vitals and triage reviewed, patient is hemodynamically stable.  Muscular pain in the lower back most likely lumbar strain.  Lumbar spine without step-off or deformity, atraumatic back pain.  Imaging deferred as I do not think it would be beneficial in this setting.  Pain elicited with bending and twisting, reassuring for muscular.  No inner leg numbness or incontinence, low concern for cauda equina.  Advise anti-inflammatories, will trial steroid injection in clinic.  Orthopedic follow-up encouraged.  School and sports note provided.  Plan of care, follow-up care return precautions given, no questions at this time.     Final Clinical Impressions(s) / UC Diagnoses   Final diagnoses:  Strain of lumbar region, initial encounter     Discharge Instructions      Your exam is consistent with a lumbar strain.  Please rest your muscles and avoid heavy lifting or sports until next week.  If your back pain persists despite rest, steroid injection and anti-inflammatories please follow-up with your  orthopedic team.  Return to clinic for any new or urgent symptoms.    ED Prescriptions   None  PDMP not reviewed this encounter.   Dreama, Cervando Durnin  N, FNP 11/30/23 1234

## 2023-11-30 NOTE — ED Triage Notes (Signed)
 Pt c/o lower back pain radiating to lt side since 12/30 while playing basketball. States doing stretches and taking ibuprofen with some relief.

## 2024-05-31 ENCOUNTER — Encounter (HOSPITAL_COMMUNITY): Payer: Self-pay | Admitting: *Deleted

## 2024-05-31 ENCOUNTER — Ambulatory Visit (HOSPITAL_COMMUNITY)
Admission: EM | Admit: 2024-05-31 | Discharge: 2024-05-31 | Disposition: A | Discharge status: Home / Self Care | Attending: Emergency Medicine | Admitting: Emergency Medicine

## 2024-05-31 ENCOUNTER — Ambulatory Visit (INDEPENDENT_AMBULATORY_CARE_PROVIDER_SITE_OTHER)

## 2024-05-31 DIAGNOSIS — S62511A Displaced fracture of proximal phalanx of right thumb, initial encounter for closed fracture: Secondary | ICD-10-CM | POA: Diagnosis not present

## 2024-05-31 MED ORDER — IBUPROFEN 800 MG PO TABS: 800.0000 mg | ORAL_TABLET | Freq: Three times a day (TID) | ORAL | 0 refills | Status: DC

## 2024-05-31 NOTE — ED Provider Notes (Signed)
 [facility]    CSN: [id number] Arrival date & time: 05/31/24  9046      History   Chief Complaint Chief Complaint  Patient presents with   Finger Injury    HPI Jesse Hart is a 18 y.o. male.   Patient presents with right thumb pain and swelling after injury that occurred when playing basketball yesterday.  Patient states that the basketball jammed into his right thumb and he began to have pain and has had increased swelling over the last day.  Patient states that he did take ibuprofen  with some relief of the pain.  Patient denies any other injuries from this incident.  The history is provided by the patient and medical records.    Past Medical History:  Diagnosis Date   Asthma     Patient Active Problem List   Diagnosis Date Noted   Grade 3 ankle sprain, left, initial encounter 12/04/2019   Eczema 01/19/2007    History reviewed. No pertinent surgical history.     Home Medications    Prior to Admission medications   Medication Sig Start Date End Date Taking? Authorizing Provider  ibuprofen  (ADVIL ) 800 MG tablet Take 1 tablet (800 mg total) by mouth 3 (three) times daily. 05/31/24  Yes Johnie Flaming A, NP  albuterol  (VENTOLIN  HFA) 108 (90 Base) MCG/ACT inhaler Inhale 2 puffs into the lungs every 6 (six) hours as needed for wheezing or shortness of breath. 09/14/22   Wedderburn, Ngozi N, NP  hydrOXYzine  (ATARAX ) 25 MG tablet Take 1 tablet (25 mg total) by mouth 3 (three) times daily as needed for anxiety. 11/18/23   Brent, Amanda C, NP  traZODone  (DESYREL ) 50 MG tablet Take 1 tablet (50 mg total) by mouth at bedtime as needed for sleep. 11/18/23   Thresa Alan BROCKS, NP    Family History History reviewed. No pertinent family history.  Social History Social History   Tobacco Use   Smoking status: Never    Passive exposure: Yes   Smokeless tobacco: Never  Vaping Use   Vaping status: Never Used  Substance Use Topics   Alcohol use: Never   Drug  use: Yes    Types: Marijuana     Allergies   Patient has no known allergies.   Review of Systems Review of Systems  Per HPI  Physical Exam Triage Vital Signs ED Triage Vitals  Encounter Vitals Group     BP 05/31/24 1020 (!) 150/78     Girls Systolic BP Percentile --      Girls Diastolic BP Percentile --      Boys Systolic BP Percentile --      Boys Diastolic BP Percentile --      Pulse Rate 05/31/24 1020 62     Resp 05/31/24 1020 16     Temp 05/31/24 1020 97.7 F (36.5 C)     Temp Source 05/31/24 1020 Oral     SpO2 05/31/24 1020 98 %     Weight --      Height --      Head Circumference --      Peak Flow --      Pain Score 05/31/24 1019 7     Pain Loc --      Pain Education --      Exclude from Growth Chart --    No data found.  Updated Vital Signs BP (!) 150/78 (BP Location: Right Arm)   Pulse 62   Temp 97.7 F (36.5 C) (  Oral)   Resp 16   SpO2 98%   Visual Acuity Right Eye Distance:   Left Eye Distance:   Bilateral Distance:    Right Eye Near:   Left Eye Near:    Bilateral Near:     Physical Exam Vitals and nursing note reviewed.  Constitutional:      General: He is awake. He is not in acute distress.    Appearance: Normal appearance. He is well-developed and well-groomed. He is not ill-appearing.  Musculoskeletal:     Right hand: Swelling, tenderness and bony tenderness present. No deformity. Decreased range of motion.     Comments: Significant swelling, mild tenderness, and decreased range of motion noted to right thumb.  Skin:    General: Skin is warm and dry.  Neurological:     Mental Status: He is alert.  Psychiatric:        Behavior: Behavior is cooperative.      UC Treatments / Results  Labs (all labs ordered are listed, but only abnormal results are displayed) Labs Reviewed - No data to display  EKG   Radiology DG Finger Thumb Right Result Date: 05/31/2024 CLINICAL DATA:  Right thumb injury after jamming finger on  basketball. EXAM: RIGHT THUMB 2+V COMPARISON:  None Available. FINDINGS: Intra-articular fracture of the first proximal phalanx medial head and neck with extension to the interphalangeal joint of the thumb. There is approximately 1-2 mm of articular surface step-off and 2 mm of ulnar displacement and minimal volar displacement. No additional fracture identified. Soft tissue swelling of the thumb. No radiopaque foreign body. IMPRESSION: Intra-articular displaced fracture of the first proximal phalanx head and neck with extension to the interphalangeal joint of the thumb. Electronically Signed   By: Harrietta Sherry M.D.   On: 05/31/2024 11:09    Procedures Procedures (including critical care time)  Medications Ordered in UC Medications - No data to display  Initial Impression / Assessment and Plan / UC Course  I have reviewed the triage vital signs and the nursing notes.  Pertinent labs & imaging results that were available during my care of the patient were reviewed by me and considered in my medical decision making (see chart for details).     Patient is overall well-appearing.  Vitals are stable.  X-ray ordered.  Based on my interpretation there is a displaced fracture of the proximal phalanx.  Radiology report confirms this.  Thumb spica splint ordered.  Prescribed 800 mg ibuprofen  as needed for pain.  Recommended alternating this with Tylenol .  Given orthopedic follow-up.  Discussed follow-up and return precautions. Final Clinical Impressions(s) / UC Diagnoses   Final diagnoses:  Closed displaced fracture of proximal phalanx of right thumb, initial encounter     Discharge Instructions      You have fractured your thumb.  We have applied a splint to help stabilize this. Alternate between 800 mg ibuprofen  and 650 mg Tylenol  every 6-8 hours as needed for pain. Follow-up with [facility] for further evaluation and management of your fracture to your thumb.      ED  Prescriptions     Medication Sig Dispense Auth. Provider   ibuprofen  (ADVIL ) 800 MG tablet Take 1 tablet (800 mg total) by mouth 3 (three) times daily. 21 tablet Johnie Flaming A, NP      PDMP not reviewed this encounter.   Johnie Flaming A, NP 05/31/24 1126

## 2024-05-31 NOTE — Discharge Instructions (Signed)
 You have fractured your thumb.  We have applied a splint to help stabilize this. Alternate between 800 mg ibuprofen  and 650 mg Tylenol  every 6-8 hours as needed for pain. Follow-up with [facility] for further evaluation and management of your fracture to your thumb.

## 2024-05-31 NOTE — ED Notes (Signed)
Called ortho tech 

## 2024-05-31 NOTE — ED Triage Notes (Signed)
 Pt states he was playing basketball yesterday and jammed his right thumb with the ball. He took IBU yesterday. Finger is swollen.

## 2024-06-04 ENCOUNTER — Ambulatory Visit: Admitting: Family Medicine

## 2024-06-04 ENCOUNTER — Ambulatory Visit (INDEPENDENT_AMBULATORY_CARE_PROVIDER_SITE_OTHER): Admitting: Orthopedic Surgery

## 2024-06-04 DIAGNOSIS — S62511A Displaced fracture of proximal phalanx of right thumb, initial encounter for closed fracture: Secondary | ICD-10-CM | POA: Diagnosis not present

## 2024-06-04 NOTE — Progress Notes (Signed)
 Jesse Hart - 18 y.o. male MRN [id number]  Date of birth: 2006-10-09  Office Visit Note: Visit Date: 06/04/2024 PCP: Lonnie Earnest, MD Referred by: Lonnie Earnest, MD  Subjective: No chief complaint on file.  HPI: Jesse Hart is a pleasant 18 y.o. male who presents today for evaluation of a right thumb injury sustained approximately 1 week prior.  Injury mechanism described as an axial load to the right thumb while playing basketball.  He was seen in the urgent care setting, underwent clinical and radiographic workup which showed a right thumb proximal phalanx fracture with intra-articular extension and displacement.  He was splinted and given close orthopedic hand surgical follow-up.  Pertinent ROS were reviewed with the patient and found to be negative unless otherwise specified above in HPI.   Visit Reason: R thumb jammed while playing basketball Duration of symptoms: 5-6 days Hand dominance: right Occupation: [employer] Diabetic: No Smoking: Yes, marijuana Heart/Lung History: none Blood Thinners: none  Prior Testing/EMG: xrays Injections (Date):none Treatments: splinting Prior Surgery: none  Assessment & Plan: Visit Diagnoses:  1. Closed displaced fracture of proximal phalanx of right thumb, initial encounter     Plan: Extensive discussion was had with patient today regarding his right thumb injury.  X-rays were reviewed which do show an intra-articular fracture of the proximal phalanx of the right thumb with displacement.  There is notable joint step-off seen.  Given the articular involvement and the fracture, we discussed treatment modalities ranging from conservative to surgical.  From a conservative standpoint, we discussed ongoing healing and the position currently in, however I did explain that given the intra-articular involvement and joint step-off, this could preclude ability for appropriate range of motion long-term, will also predispose to potential  posttraumatic arthritis.  From a surgical standpoint, we discussed right thumb closed reduction and percutaneous pinning in order to correct the displacement and restore the joint line appropriately.  The benefits of this procedure would be to promote fracture healing by providing stability and to heal the fracture in the appropriate alignment. The alternatives of this surgery would be to treat the fracture with immobilization in a splint/brace/cast or to do no intervention. The patient's questions were answered to his satisfaction. After this discussion, patient elected to proceed with surgery. Informed consent was obtained.   Risks and benefits of the procedure were discussed, risks including but not limited to infection, bleeding, scarring, stiffness, nerve injury, tendon injury, vascular injury, hardware complication, recurrence of symptoms and need for subsequent operation.  Patient expressed understanding.  We also discussed the appropriate timeline of healing and return to activities.  Will move forward surgical scheduling of right thumb closed reduction percutaneous pinning of proximal phalanx intra-articular fracture, surgery will be performed this week.   Follow-up: No follow-ups on file.   Meds & Orders: No orders of the defined types were placed in this encounter.  No orders of the defined types were placed in this encounter.    Procedures: No procedures performed      Clinical History: No specialty comments available.  He reports that he has never smoked. He has been exposed to tobacco smoke. He has never used smokeless tobacco.  Recent Labs    11/17/23 1115  HGBA1C 5.3    Objective:   Vital Signs: There were no vitals taken for this visit.  Physical Exam  Gen: Well-appearing, in no acute distress; non-toxic CV: Regular Rate. Well-perfused. Warm.  Resp: Breathing unlabored on room air; no wheezing. Psych: Fluid  speech in conversation; appropriate affect; normal thought  process  Ortho Exam Right thumb: - Skin is intact, mild swelling notable over the IP region, IP range of motion is limited secondary to pain - Nail plate is well located within the eponychial fold, sensation is intact distally both radially and ulnarly, normal color and capillary refill throughout the digit - No tenderness over the MCP region or proximally throughout the hand  Imaging: No results found. Prior x-rays of the right thumb were reviewed which show intra-articular fracture of the proximal phalanx, extension of the fracture into the IP joint with notable displacement and joint step-off  Past Medical/Family/Surgical/Social History: Medications & Allergies reviewed per EMR, new medications updated. Patient Active Problem List   Diagnosis Date Noted   Grade 3 ankle sprain, left, initial encounter 12/04/2019   Eczema 01/19/2007   Past Medical History:  Diagnosis Date   Asthma    No family history on file. No past surgical history on file. Social History   Occupational History   Not on file  Tobacco Use   Smoking status: Never    Passive exposure: Yes   Smokeless tobacco: Never  Vaping Use   Vaping status: Never Used  Substance and Sexual Activity   Alcohol use: Never   Drug use: Yes    Types: Marijuana   Sexual activity: Not Currently    Birth control/protection: None    Dariann Huckaba Estela) Arlinda, M.D. [facility], Hand Surgery

## 2024-06-05 ENCOUNTER — Other Ambulatory Visit: Payer: Self-pay

## 2024-06-05 DIAGNOSIS — S62511A Displaced fracture of proximal phalanx of right thumb, initial encounter for closed fracture: Secondary | ICD-10-CM

## 2024-06-07 ENCOUNTER — Other Ambulatory Visit: Payer: Self-pay | Admitting: Orthopedic Surgery

## 2024-06-07 DIAGNOSIS — S62511A Displaced fracture of proximal phalanx of right thumb, initial encounter for closed fracture: Secondary | ICD-10-CM | POA: Diagnosis not present

## 2024-06-07 MED ORDER — OXYCODONE HCL 5 MG PO TABS: 5.0000 mg | ORAL_TABLET | Freq: Four times a day (QID) | ORAL | 0 refills | Status: DC | PRN

## 2024-06-11 ENCOUNTER — Encounter: Admitting: Student

## 2024-06-11 NOTE — Patient Instructions (Incomplete)
 It was great to see you! Thank you for allowing me to participate in your care!  I recommend that you always bring your medications to each appointment as this makes it easy to ensure we are on the correct medications and helps us  not miss when refills are needed.  Our plans for today:  - Check up  Screening for Sexually Transmitted Diseases  Screening for Hepatitis viral infections  - Vaccines  You are due for:   Covid   Pneumococcal   Meningococcal   Hepatitis B  We are checking some labs today, I will call you if they are abnormal will send you a MyChart message or a letter if they are normal.  If you do not hear about your labs in the next 2 weeks please let us  know.***  Take care and seek immediate care sooner if you develop any concerns.   Dr. Penne Rhein, MD [facility]

## 2024-06-11 NOTE — Progress Notes (Deleted)
    SUBJECTIVE:   Chief compliant/HPI: annual examination  Jesse Hart is a 18 y.o. who presents today for an annual exam.   Reviewed and updated history ***.   Review of systems form notable for ***.    OBJECTIVE:   There were no vitals taken for this visit.  ***  ASSESSMENT/PLAN:   Assessment & Plan  Annual Examination  See AVS for age appropriate recommendations  PHQ score ***, reviewed and discussed.  Blood pressure reviewed and at goal. ***   Advanced directive ***   Considered the following items based upon USPSTF recommendations: HIV testing: {not indicated/requested/declined:14582} Hepatitis C: {not indicated/requested/declined:14582} Hepatitis B: {not indicated/requested/declined:14582} Syphilis if at high risk: {{not indicated/requested/declined:14582} GC/CT{not indicated/requested/declined:14582} Lipid panel (nonfasting or fasting) discussed based upon AHA recommendations and {ordered not order:23822}.  Consider repeat every 4-6 years.  Reviewed risk factors for latent tuberculosis and {not indicated/requested/declined:14582} Immunizations ***   Follow up in 1 year or sooner if indicated.  MyChart Activation: {MYCHARTLIST:32522}  Penne Rhein, MD [facility]

## 2024-06-13 ENCOUNTER — Encounter: Payer: Self-pay | Admitting: Student

## 2024-06-13 ENCOUNTER — Ambulatory Visit (INDEPENDENT_AMBULATORY_CARE_PROVIDER_SITE_OTHER): Admitting: Student

## 2024-06-13 ENCOUNTER — Other Ambulatory Visit (HOSPITAL_COMMUNITY)
Admission: RE | Admit: 2024-06-13 | Discharge: 2024-06-13 | Disposition: A | Discharge status: Home / Self Care | Source: Ambulatory Visit | Attending: Family Medicine | Admitting: Family Medicine

## 2024-06-13 VITALS — BP 138/85 | HR 70 | Ht 73.0 in | Wt 177.8 lb

## 2024-06-13 DIAGNOSIS — Z113 Encounter for screening for infections with a predominantly sexual mode of transmission: Secondary | ICD-10-CM

## 2024-06-13 DIAGNOSIS — Z23 Encounter for immunization: Secondary | ICD-10-CM | POA: Diagnosis not present

## 2024-06-13 DIAGNOSIS — R030 Elevated blood-pressure reading, without diagnosis of hypertension: Secondary | ICD-10-CM

## 2024-06-13 NOTE — Assessment & Plan Note (Addendum)
 Patient noted to have elevated blood pressure reading in the past, and elevated today above goal < 140/90.  Will have patient come in for ambulatory blood pressure monitoring, to evaluate if there is a need for medication. - Ambulatory blood pressure monitoring

## 2024-06-13 NOTE — Progress Notes (Signed)
    SUBJECTIVE:   Chief compliant/HPI: annual examination  Jesse Hart is a 18 y.o. who presents today for an annual exam.   Reviewed and updated history .   Review of systems form notable for N/a.   Patient would like STI testing, is sexually active with one partner, uses protection, and is asymptomatic. Recently negative for HIV per patient.   Broken Thumb Patient noted to have broken thumb and has had surgery with ortho already. Reports healing is going well and he is not in pain.    OBJECTIVE:   BP 138/85   Pulse 70   Ht 6' 1 (1.854 m)   Wt 177 lb 12.8 oz (80.6 kg)   SpO2 100%   BMI 23.46 kg/m   Physical Exam Constitutional:      General: He is not in acute distress.    Appearance: Normal appearance. He is not ill-appearing.  Cardiovascular:     Rate and Rhythm: Normal rate and regular rhythm.     Heart sounds: Normal heart sounds. No murmur heard.    No friction rub. No gallop.  Pulmonary:     Effort: Pulmonary effort is normal. No respiratory distress.     Breath sounds: No stridor. No wheezing, rhonchi or rales.  Abdominal:     General: Abdomen is flat. There is no distension.     Palpations: Abdomen is soft. There is no mass.     Tenderness: There is no abdominal tenderness. There is no guarding or rebound.     Hernia: No hernia is present.  Neurological:     Mental Status: He is alert.  Psychiatric:        Mood and Affect: Mood normal.        Behavior: Behavior normal.      ASSESSMENT/PLAN:   Assessment & Plan Elevated blood pressure reading Patient noted to have elevated blood pressure reading in the past, and elevated today above goal < 140/90.  Will have patient come in for ambulatory blood pressure monitoring, to evaluate if there is a need for medication. - Ambulatory blood pressure monitoring Routine screening for STI (sexually transmitted infection) Patient comes in for routine STI screening, with no symptoms.  Patient is sexually active  with 1 partner, uses protection consistently. - Urine cytology for STIs Annual Examination  See AVS for age appropriate recommendations  PHQ score N/a, but mood is good and not low.  Blood pressure reviewed and not at goal, will send for ambulatory BP monitoring.    Considered the following items based upon USPSTF recommendations: HIV testing: declined Hepatitis C: declined Hepatitis B: declined Syphilis if at high risk: {declined GC/CTrequested Lipid panel (nonfasting or fasting) discussed based upon AHA recommendations and not ordered.  Consider repeat every 4-6 years.  Reviewed risk factors for latent tuberculosis and not indicated Immunizations Here for his Menigococal shots   Follow up in 1 year or sooner if indicated.  MyChart Activation: Declined  Penne Rhein, MD [facility]

## 2024-06-13 NOTE — Patient Instructions (Addendum)
 It was great to see you! Thank you for allowing me to participate in your care!  I recommend that you always bring your medications to each appointment as this makes it easy to ensure we are on the correct medications and helps us  not miss when refills are needed.  Our plans for today:  - Check Up  Checking for Sexually Transmitted Infections   Gonorrhea, Chlamydia, Trichimonas  - Elevated Blood Pressure Your blood pressure has been elevated today and in the past. This may require medication in the future.  Schedule an appointment with Dr. Maude Lagos, here at the [facility], for ambulatory blood pressure monitoring  You will wear a blood pressure cuff for 24 hours to see if your blood pressure is truly elevated.  We are checking some labs today, I will call you if they are abnormal will send you a MyChart message or a letter if they are normal.  If you do not hear about your labs in the next 2 weeks please let us  know.  Take care and seek immediate care sooner if you develop any concerns.   Dr. Penne Rhein, MD [facility]

## 2024-06-15 ENCOUNTER — Ambulatory Visit: Payer: Self-pay | Admitting: Student

## 2024-06-15 LAB — URINE CYTOLOGY ANCILLARY ONLY
Chlamydia: NEGATIVE
Comment: NEGATIVE
Comment: NEGATIVE
Comment: NORMAL
Neisseria Gonorrhea: NEGATIVE
Trichomonas: NEGATIVE

## 2024-06-19 ENCOUNTER — Ambulatory Visit: Admitting: Pharmacist

## 2024-06-21 ENCOUNTER — Ambulatory Visit: Admitting: Orthopedic Surgery

## 2024-06-21 ENCOUNTER — Other Ambulatory Visit (INDEPENDENT_AMBULATORY_CARE_PROVIDER_SITE_OTHER): Payer: Self-pay

## 2024-06-21 DIAGNOSIS — S62511A Displaced fracture of proximal phalanx of right thumb, initial encounter for closed fracture: Secondary | ICD-10-CM

## 2024-06-21 NOTE — Progress Notes (Unsigned)
   Jesse Hart - 18 y.o. male MRN [id number]  Date of birth: Apr 17, 2006  Office Visit Note: Visit Date: 06/21/2024 PCP: Lonnie Earnest, MD Referred by: Lonnie Earnest, MD  Subjective:  HPI: Jesse Hart is a 18 y.o. male who presents today for follow up 2 weeks status post closed reduction and percutaneous pinning right thumb proximal phalanx fracture.  He is doing well overall, pain is controlled.  Denies any numbness or tingling.  Has been compliant with splinting as instructed.  Pertinent ROS were reviewed with the patient and found to be negative unless otherwise specified above in HPI.   Assessment & Plan: Visit Diagnoses:  1. Closed displaced fracture of proximal phalanx of right thumb, initial encounter     Plan: He is doing well postoperative.  X-rays obtained today show stable appearance of the right thumb proximal phalanx fracture, pins remain well-fixed.  Thumb spica cast will be applied today.  Follow-up in 2 weeks for repeat clinical and radiographic check.  Follow-up: No follow-ups on file.   Meds & Orders: No orders of the defined types were placed in this encounter.   Orders Placed This Encounter  Procedures   XR Finger Thumb Right     Procedures: No procedures performed       Objective:   Vital Signs: There were no vitals taken for this visit.  Ortho Exam Right thumb: - Pin sites clean dry and intact, appropriate alignment of the thumb - Normal color and capillary refill distally, sensation intact  Imaging: XR Finger Thumb Right Result Date: 06/22/2024 X-rays of the right thumb demonstrate stable appearance of the pin fixation of the proximal phalanx fracture of the thumb, joint remains well reduced in all planes at the IP level.    Anshul Afton Alderton, M.D. [facility], Hand Surgery

## 2024-07-05 ENCOUNTER — Ambulatory Visit (INDEPENDENT_AMBULATORY_CARE_PROVIDER_SITE_OTHER): Payer: Self-pay

## 2024-07-05 ENCOUNTER — Encounter: Payer: Self-pay | Admitting: Occupational Therapy

## 2024-07-05 ENCOUNTER — Other Ambulatory Visit: Payer: Self-pay

## 2024-07-05 ENCOUNTER — Ambulatory Visit: Attending: Orthopedic Surgery | Admitting: Occupational Therapy

## 2024-07-05 ENCOUNTER — Ambulatory Visit (INDEPENDENT_AMBULATORY_CARE_PROVIDER_SITE_OTHER): Admitting: Orthopedic Surgery

## 2024-07-05 DIAGNOSIS — M6281 Muscle weakness (generalized): Secondary | ICD-10-CM | POA: Diagnosis not present

## 2024-07-05 DIAGNOSIS — R29898 Other symptoms and signs involving the musculoskeletal system: Secondary | ICD-10-CM | POA: Insufficient documentation

## 2024-07-05 DIAGNOSIS — R278 Other lack of coordination: Secondary | ICD-10-CM | POA: Insufficient documentation

## 2024-07-05 DIAGNOSIS — S62511A Displaced fracture of proximal phalanx of right thumb, initial encounter for closed fracture: Secondary | ICD-10-CM

## 2024-07-05 NOTE — Patient Instructions (Signed)
 Jesse Hart

## 2024-07-05 NOTE — Progress Notes (Signed)
   Jesse Hart - 18 y.o. male MRN [id number]  Date of birth: 07-30-2006  Office Visit Note: Visit Date: 07/05/2024 PCP: Lonnie Earnest, MD Referred by: Lonnie Earnest, MD  Subjective:  HPI: Jesse Hart is a 18 y.o. male who presents today for follow up 4 weeks status post closed reduction and percutaneous pinning right thumb proximal phalanx fracture .  He is doing well overall, pain remains well-controlled.  Is been compliant with casting as instructed.  Pertinent ROS were reviewed with the patient and found to be negative unless otherwise specified above in HPI.   Assessment & Plan: Visit Diagnoses: No diagnosis found.  Plan: Removed today, x-rays demonstrate appropriate healing of the right thumb proximal phalanx fracture.  Pins removed today without incident.  He will be seen by occupational therapy today for fabrication of a thumb spica orthosis and we can begin range of motion exercises.  Follow-up with myself in approximately 2 to 3 weeks for repeat clinical and radiographic check.  Follow-up: No follow-ups on file.   Meds & Orders: No orders of the defined types were placed in this encounter.  No orders of the defined types were placed in this encounter.    Procedures: No procedures performed       Objective:   Vital Signs: There were no vitals taken for this visit.  Ortho Exam Right thumb pins removed today without incident, pin sites remain clean dry and intact, he will perform gentle range of motion of the IP joint without significant pain, no significant tenderness over the thumb diffusely, sensation intact distally, thumb remains warm well-perfused, capillary refill is appropriate  Imaging: No results found.   Lynne Righi Afton Alderton, M.D. [facility], Hand Surgery

## 2024-07-05 NOTE — Therapy (Signed)
 OUTPATIENT OCCUPATIONAL THERAPY ORTHO EVALUATION  Patient Name: Jesse Hart MRN: [id number] DOB:09-28-2006, 18 y.o., male Today's Date: 07/05/2024  PCP: Lonnie Earnest, MD REFERRING PROVIDER: Arlinda Buster, MD  END OF SESSION:  OT End of Session - 07/05/24 1020     Visit Number 1    Number of Visits 16    Date for OT Re-Evaluation 09/22/24    Authorization Type [employer] 2025 $4 Copay    OT Start Time 1020    OT Stop Time 1145    OT Time Calculation (min) 85 min    Equipment Utilized During Treatment Thermoplastic splinting material    Activity Tolerance Patient tolerated treatment well    Behavior During Therapy WFL for tasks assessed/performed          Past Medical History:  Diagnosis Date   Asthma    History reviewed. No pertinent surgical history. Patient Active Problem List   Diagnosis Date Noted   Elevated blood pressure reading 06/13/2024   Grade 3 ankle sprain, left, initial encounter 12/04/2019   Eczema 01/19/2007    ONSET DATE: 06/05/2024 (Referral); 05/30/24 (Injury);  06/07/24 (Surgery)  REFERRING DIAG: D37.488J (ICD-10-CM) - Closed displaced fracture of proximal phalanx of right thumb, initial encounter  THERAPY DIAG:  Muscle weakness (generalized)  Other lack of coordination  Other symptoms and signs involving the musculoskeletal system  Rationale for Evaluation and Treatment: Rehabilitation  SUBJECTIVE:   SUBJECTIVE STATEMENT: Pt saw MD this morning and had his cast taken off along with removal of pins and and xray prior to arrival to therapy.  Pt reports he [facility] his [place] [facility] [employer] [facility] j[facility] his [place].  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  Workouts are beginning but season games begin in November.   Pt accompanied by: self  PERTINENT HISTORY: PMHx: Asthma and eczema   Date of surgery 06/07/24 - right thumb closed  reduction percutaneous pinning of proximal phalanx   PRECAUTIONS: None  RED FLAGS: None   WEIGHT BEARING RESTRICTIONS: No  PAIN:  Are you having pain? No  FALLS: Has patient fallen in last 6 months? 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  HAND DOMINANCE: Right  ADLs: WFL - modify with L hand  FUNCTIONAL OUTCOME MEASURES: Quick Dash: 47.7  UPPER EXTREMITY ROM:     Active ROM Left eval Right eval  Shoulder flexion    Shoulder abduction    Shoulder adduction    Shoulder extension    Shoulder internal rotation    Shoulder external rotation    Elbow flexion    Elbow extension    Wrist flexion 70 60  Wrist extension 75 55  Wrist ulnar deviation    Wrist radial deviation    Wrist pronation    Wrist supination    (Blank rows = not tested)  Active ROM Right eval Left eval  Thumb MCP (0-60)    Thumb IP (0-80) 0 WNL  Thumb Radial abd/add (0-55)     Thumb Palmar abd/add (0-45) 35  50  Thumb Opposition to Small Finger base  tip  Index MCP (0-90)     Index PIP (0-100)     Index DIP (0-70)      Long MCP (0-90)      Long PIP (0-100)      Long DIP (0-70)      Ring MCP (0-90)      Ring PIP (0-100)      Ring DIP (0-70)      Little MCP (0-90)      Little PIP (0-100)      Little DIP (0-70)      (Blank rows = not tested)   UPPER EXTREMITY MMT:     MMT Right eval Left eval  Shoulder flexion 5 5  Shoulder abduction    Shoulder adduction    Shoulder extension    Shoulder internal rotation    Shoulder external rotation    Middle trapezius    Lower trapezius    Elbow flexion 5 5  Elbow extension  5 5  Wrist flexion    Wrist extension    Wrist ulnar deviation    Wrist radial deviation    Wrist pronation    Wrist supination    (Blank rows = not tested)  HAND FUNCTION: Grip strength: Right: NT lbs; Left: 79.8, 62.1, 66.5 lbs Average 69.5 lbs  COORDINATION: WFL L UE and decreased but not tested R UE due to poor thumb ROM  SENSATION: WFL  EDEMA: Circumferential measurement of L thumb prox phal 6.8 cm and R thumb 7.8 cm  COGNITION: Overall cognitive status: Within functional limits for tasks assessed  TODAY'S TREATMENT:                                                                                                                            None billed due to payor source.  Orthotic Fabrication: Pt was fitted with a R custom fabricated thumb spica splint placing thumb functional position following a proximal phanlx fracture of the thumb with pins removed today. Pt was educated to wear the splint at all times for protection, he was educated in splinting use, care and precautions.   Self Care: Pt is exactly 4 weeks post surgery today and pt education provided re: progression of splint ie) wrist based thumb spica today to a hand based thumb spica later and gradual decreased in day/nighttime use over 10 weeks.  Therapeutic Exercises: Provided pt with instruction and demonstration of gentle thumb, finger and wrist ROM with focus on movement with his own muscle power rather than passive stretching at this time.  See pt instructions for handouts printed for pt.   PATIENT EDUCATION: Education details: OT role, POC considerations, splint wear and HEP ideas Person educated: Patient Education method: Explanation, Demonstration, Tactile cues, Verbal cues, and Handouts Education comprehension: verbalized understanding, returned demonstration, verbal cues required, tactile cues required, and needs further education  HOME EXERCISE PROGRAM: 07/05/24: Thumb, finger and wrist  AROM  GOALS: Goals reviewed with patient? Yes  SHORT TERM GOALS: Target date: 08/03/24   Patient will demonstrate initial RUE HEP with 25% verbal cues or less for proper execution. Baseline: New to outpt OT Goal status: IN Progress - Exc issued at eval.   2.  Pt will be independent with initial splint wear and care for RUE Baseline: New to outpt OT  Goal status: MET 07/05/24: Custom wrist based thumb spica splint fabricated at evaluation.    LONG TERM GOALS: Target date: 09/22/24   1. Pt will be independent with subsequent splint wear and care and/or taping for RUE to prevent injury to thumb. Baseline: Custom wrist based thumb spica splint fabricated at evaluation.  Goal status: INITIAL  2. Patient will demonstrate updated RUE HEP with visual instruction only for proper execution. Baseline: New to outpt OT Goal status: IN Progress - Initial HEP ideas issued at eval.   3.  Pt will demonstrate equal wrist ROM in dominant RUE.  Baseline:   Wrist flexion L 70 R 60  Wrist extension L 75 R 55  Goal status: INITIAL  4. Pt will demonstrate full composite flexion of R thumb for closed fist position.  Baseline - no active IP flexion unless proximal phalange was stabilized by splint.  Goals Status: INITIAL   5.  Patient will demonstrate at least >50 lbs grip strength in dominant RUE grip strength as needed to open jars and other containers. Baseline: Right: NT lbs Left: 69.5 lbs Goal status: INITIAL   6.  Patient will demonstrate at least 16% improvement with quick Dash score (reporting <25% disability or less) indicating improved functional use of affected extremity.  Baseline: QuickDash 47.7% Goal status: INITIAL  ASSESSMENT:  CLINICAL IMPRESSION: Patient is a 18 y.o. male who was seen today for occupational therapy evaluation s/p right thumb fracture and closed reduction percutaneous pinning of proximal phalanx. Hx includes asthma. Patient currently presents well below  baseline level of function with dominant RUE demonstrating functional deficits and impairments as noted below. Pt would benefit from skilled OT services in the outpatient setting to work on impairments as noted below to help pt return to PLOF as able.     PERFORMANCE DEFICITS: in functional skills including coordination, dexterity, edema, ROM, strength, pain, flexibility, Fine motor control, Gross motor control, body mechanics, decreased knowledge of precautions, and UE functional use, cognitive skills including safety awareness, and psychosocial skills including coping strategies and routines and behaviors.   IMPAIRMENTS: are limiting patient from IADLs, education, leisure, and social participation.   COMORBIDITIES: has no other co-morbidities that affects occupational performance. Patient will benefit from skilled OT to address above impairments and improve overall function.  MODIFICATION OR ASSISTANCE TO COMPLETE EVALUATION: Maximum or significant modification of tasks or assist is necessary to complete an evaluation.  OT OCCUPATIONAL PROFILE AND HISTORY: Comprehensive assessment: Review of records and extensive additional review of physical, cognitive, psychosocial history related to current functional performance.  CLINICAL DECISION MAKING: High - multiple treatment options, significant modification of task necessary  REHAB POTENTIAL: Excellent  EVALUATION COMPLEXITY: High      PLAN:  OT FREQUENCY: 1-2x/week  OT DURATION: 10 weeks  PLANNED INTERVENTIONS: 97168 OT Re-evaluation, 97535 self care/ADL training, 02889 therapeutic exercise, 97530 therapeutic activity, 97112 neuromuscular re-education, 97140 manual therapy, 97035 ultrasound, 97039 fluidotherapy, 97760 Orthotic Initial, [id number] Orthotic/Prosthetic subsequent, scar mobilization, passive range of motion, energy conservation, coping strategies training, patient/family education, and DME and/or AE instructions  RECOMMENDED  OTHER SERVICES: NA  CONSULTED AND AGREED WITH PLAN OF CARE: Patient  PLAN FOR NEXT SESSION:  Check splint - transition to hand based Progress HEP Strengthening ~ 8+ weeks DOS 06/07/24 Explore thumb taping for safety s/p healed   Clarita LITTIE Pride, OT 07/05/2024, 9:39 PM

## 2024-07-06 ENCOUNTER — Ambulatory Visit (INDEPENDENT_AMBULATORY_CARE_PROVIDER_SITE_OTHER): Admitting: Family Medicine

## 2024-07-06 VITALS — BP 153/90 | HR 77 | Ht 73.0 in | Wt 192.4 lb

## 2024-07-06 DIAGNOSIS — Z1159 Encounter for screening for other viral diseases: Secondary | ICD-10-CM | POA: Diagnosis not present

## 2024-07-06 DIAGNOSIS — R030 Elevated blood-pressure reading, without diagnosis of hypertension: Secondary | ICD-10-CM | POA: Diagnosis not present

## 2024-07-06 DIAGNOSIS — Z23 Encounter for immunization: Secondary | ICD-10-CM | POA: Diagnosis not present

## 2024-07-06 DIAGNOSIS — Z130 Encounter for screening for diseases of the blood and blood-forming organs and certain disorders involving the immune mechanism: Secondary | ICD-10-CM | POA: Diagnosis not present

## 2024-07-06 DIAGNOSIS — F411 Generalized anxiety disorder: Secondary | ICD-10-CM

## 2024-07-06 DIAGNOSIS — Z114 Encounter for screening for human immunodeficiency virus [HIV]: Secondary | ICD-10-CM

## 2024-07-06 MED ORDER — HYDROXYZINE HCL 25 MG PO TABS: 25.0000 mg | ORAL_TABLET | Freq: Three times a day (TID) | ORAL | 0 refills | Status: AC | PRN

## 2024-07-06 NOTE — Patient Instructions (Signed)
 It was wonderful to see you today.  Please bring ALL of your medications with you to every visit.   Today we talked about:  Anxiety - I have refilled your hydroxyzine . However, You should be set up with a medication that can prevent anxiety as well rather than treat it in the moment. You should followu p in 3 months to discuss this with your PCP or with me.   I have ordered an HIV, Hep C, and sickle cell screening test.   We have scheduled your appointments for ambulatory 24 hour blood pressure monitoring.   Thank you for choosing [facility].   Please call [phone] with any questions about today's appointment.  Please be sure to schedule follow up at the front desk before you leave today.   Areta Saliva, MD  Family Medicine

## 2024-07-06 NOTE — Progress Notes (Signed)
    SUBJECTIVE:   CHIEF COMPLAINT / HPI:   Elevated blood pressure reading Patient has had multiple elevated blood pressure readings at past office visits.  Thought he was supposed to get an ambulatory blood pressure monitor today.  Says that he thinks this elevated blood pressure could be due to anxiety when he comes to office. Is unsure of family history of hypertension.  Anxiety Patient endorses history of generalized anxiety.  He has previously been on Atarax  and trazodone  when he has gone to the behavioral health center in the past.  He finds relief from hydroxyzine  as needed.  He sees a therapist regularly currently.  He finds this helpful.   PERTINENT  PMH / PSH: GAD  OBJECTIVE:   BP (!) 153/90   Pulse 77   Ht 6' 1 (1.854 m)   Wt 192 lb 6.4 oz (87.3 kg)   SpO2 100%   BMI 25.38 kg/m   General: well appearing, in no acute distress CV: RRR, radial pulses equal and palpable Resp: Normal work of breathing on room air Abd: Soft, non tender, non distended, no abdominal bruits  Neuro: Alert & Oriented Psyc: pleasant, conversant, slightly anxious    ASSESSMENT/PLAN:   Assessment & Plan Elevated blood pressure reading Multiple previous elevated blood pressure readings could be due to white coat hypertension. Less likely secondary hypertension. Low likelihood of metabolic disease.  - scheduled for ambulatory blood pressure appt with Dr. Koval next week  GAD (generalized anxiety disorder) Currently receiving therapy. Could improve control with a prophylactic medicine rather than just hydroxyzine . Patient is open to this.  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    Areta Saliva, MD [facility]

## 2024-07-07 NOTE — Assessment & Plan Note (Signed)
 Multiple previous elevated blood pressure readings could be due to white coat hypertension. Less likely secondary hypertension. Low likelihood of metabolic disease.  - scheduled for ambulatory blood pressure appt with Dr. Koval next week

## 2024-07-10 ENCOUNTER — Other Ambulatory Visit: Payer: Self-pay

## 2024-07-12 ENCOUNTER — Ambulatory Visit: Admitting: Pharmacist

## 2024-07-12 ENCOUNTER — Other Ambulatory Visit: Payer: Self-pay

## 2024-07-13 ENCOUNTER — Ambulatory Visit: Admitting: Pharmacist

## 2024-07-16 ENCOUNTER — Telehealth: Payer: Self-pay | Admitting: Pharmacist

## 2024-07-16 NOTE — Telephone Encounter (Signed)
 Attempted to contact patient for follow-up of missed ambulatory Blood pressure appointment  Left HIPAA compliant voice mail requesting call back to direct phone: [phone] Patient may reschedule at their convenience  Total time with patient call and documentation of interaction: 1 minute.

## 2024-07-17 DIAGNOSIS — Z130 Encounter for screening for diseases of the blood and blood-forming organs and certain disorders involving the immune mechanism: Secondary | ICD-10-CM | POA: Diagnosis not present

## 2024-07-18 ENCOUNTER — Telehealth: Payer: Self-pay | Admitting: Orthopedic Surgery

## 2024-07-18 ENCOUNTER — Encounter: Admitting: Orthopedic Surgery

## 2024-07-18 NOTE — Telephone Encounter (Signed)
 LVM for pt to cb.

## 2024-07-18 NOTE — Telephone Encounter (Signed)
 Pt's grandmother called stating he needed to reschedule due to pt has class. Explained to send to Dr Erwin just incase pt need sooner appt then 9/16 @ 9:45 am. Pt phone number is [phone]

## 2024-07-19 ENCOUNTER — Ambulatory Visit: Payer: Self-pay | Admitting: Occupational Therapy

## 2024-07-19 NOTE — Telephone Encounter (Signed)
 Moved pt up to 9/2

## 2024-07-24 ENCOUNTER — Encounter: Admitting: Orthopedic Surgery

## 2024-07-26 ENCOUNTER — Other Ambulatory Visit (INDEPENDENT_AMBULATORY_CARE_PROVIDER_SITE_OTHER): Payer: Self-pay

## 2024-07-26 ENCOUNTER — Ambulatory Visit (INDEPENDENT_AMBULATORY_CARE_PROVIDER_SITE_OTHER): Admitting: Orthopedic Surgery

## 2024-07-26 DIAGNOSIS — S62511A Displaced fracture of proximal phalanx of right thumb, initial encounter for closed fracture: Secondary | ICD-10-CM

## 2024-07-26 NOTE — Progress Notes (Signed)
   Jesse Hart - 18 y.o. male MRN [id number]  Date of birth: 09/14/2006  Office Visit Note: Visit Date: 07/26/2024 PCP: Lonnie Earnest, MD Referred by: Lonnie Earnest, MD  Subjective:  HPI: Jesse Hart is a 18 y.o. male who presents today for follow up 7 weeks status post closed reduction and percutaneous pinning right thumb proximal phalanx fracture.  Is doing well overall, pain is well-controlled.  Has had some difficulty getting in with occupational therapy due to his school schedule, does have an upcoming visit.  Is currently in the removable splint.  Pertinent ROS were reviewed with the patient and found to be negative unless otherwise specified above in HPI.   Assessment & Plan: Visit Diagnoses:  1. Closed displaced fracture of proximal phalanx of right thumb, initial encounter     Plan: He is doing well overall.  X-rays obtained today show stable appearance of the right thumb proximal phalanx fracture with appropriately healing.  He begin to wean from the splint at this time and continue with range of motion protocol.  I did recommend that he move forward with occupational therapy particularly for range of motion and progression of strengthening.  He can transition to home exercise when appropriate.  Follow-up myself in approximate 1 month.  Follow-up: No follow-ups on file.   Meds & Orders: No orders of the defined types were placed in this encounter.   Orders Placed This Encounter  Procedures   XR Finger Thumb Right     Procedures: No procedures performed       Objective:   Vital Signs: There were no vitals taken for this visit.  Ortho Exam Right thumb well aligned, no evidence of deformity or rotational abnormality, normal color and capillary refill, no significant tenderness, thumb IP motion 0-35, slightly improved passively without significant pain  Imaging: XR Finger Thumb Right Result Date: 07/26/2024 X-rays demonstrate stable appearance and appropriate  interval healing of previously known thumb proximal phalanx fracture, IP joint mains well located in all planes.    Nickie Warwick Afton Alderton, M.D. [facility], Hand Surgery

## 2024-08-02 ENCOUNTER — Ambulatory Visit: Admitting: Occupational Therapy

## 2024-08-07 ENCOUNTER — Encounter: Admitting: Orthopedic Surgery

## 2024-08-23 ENCOUNTER — Encounter: Admitting: Orthopedic Surgery

## 2024-09-13 ENCOUNTER — Ambulatory Visit: Admitting: Orthopedic Surgery

## 2024-09-13 ENCOUNTER — Other Ambulatory Visit (INDEPENDENT_AMBULATORY_CARE_PROVIDER_SITE_OTHER): Payer: Self-pay

## 2024-09-13 DIAGNOSIS — S62511A Displaced fracture of proximal phalanx of right thumb, initial encounter for closed fracture: Secondary | ICD-10-CM | POA: Diagnosis not present

## 2024-09-13 NOTE — Progress Notes (Signed)
   Jesse Hart - 18 y.o. male MRN [id number]  Date of birth: [birth year]  Office Visit Note: Visit Date: 09/13/2024 PCP: Lonnie Earnest, MD Referred by: Lonnie Earnest, MD  Subjective:  HPI: Jesse Hart is a 18 y.o. male who presents today for follow up 3 months status post closed reduction and percutaneous pinning right thumb proximal phalanx fracture .  He was unfortunately unable to do extensive occupational therapy postoperatively, he does have some ongoing stiffness.  Pain is well-controlled.  Pertinent ROS were reviewed with the patient and found to be negative unless otherwise specified above in HPI.   Assessment & Plan: Visit Diagnoses:  1. Closed displaced fracture of proximal phalanx of right thumb, initial encounter     Plan: He is doing quite well postoperatively.  X-rays obtained today show stable appearance of the proximal phalanx fracture of the thumb.  If he would like, we could send a new referral for occupational therapy in order to work on range of motion exercises for the right thumb, he is also welcome to do home exercises as tolerated.  From my standpoint, he can follow-up with me as needed  Follow-up: No follow-ups on file.   Meds & Orders: No orders of the defined types were placed in this encounter.   Orders Placed This Encounter  Procedures   XR Finger Thumb Right   Ambulatory referral to Occupational Therapy     Procedures: No procedures performed       Objective:   Vital Signs: There were no vitals taken for this visit.  Ortho Exam Right thumb: - Thumb in appropriate alignment, no rotation abnormality, IP range of motion is limited 0-35 actively, slightly improved passively, contralateral thumb 0-60 IP motion - Thumb with appropriate color and capillary refill, sensation is intact, no tenderness  Imaging: No results found. X-rays of the right thumb obtained today  Marchia Diguglielmo Estela) Elenie Coven, M.D. [facility], Hand Surgery

## 2024-09-24 NOTE — Therapy (Signed)
 OUTPATIENT OCCUPATIONAL THERAPY ORTHO EVALUATION  Patient Name: Jesse Hart MRN: [id number] DOB:[birth year], 18 y.o., male Today's Date: 09/25/2024  PCP: Lonnie Earnest, MD REFERRING PROVIDER: Brutus Boast, MD  END OF SESSION:  OT End of Session - 09/25/24 1359     Visit Number 1    Number of Visits 9   including eval   Date for Recertification  10/27/24    Authorization Type [employer] 2025 $4 Copay    OT Start Time 1310    OT Stop Time 1350    OT Time Calculation (min) 40 min    Activity Tolerance Patient tolerated treatment well    Behavior During Therapy WFL for tasks assessed/performed          Past Medical History:  Diagnosis Date   Asthma    No past surgical history on file. Patient Active Problem List   Diagnosis Date Noted   Elevated blood pressure reading 06/13/2024   Grade 3 ankle sprain, left, initial encounter 12/04/2019   Eczema 01/19/2007    ONSET DATE: 09/13/24 referral date, 06/07/24 DOS  REFERRING DIAG: D37.488J (ICD-10-CM) - Closed displaced fracture of proximal phalanx of right thumb, initial encounter  THERAPY DIAG:  Other symptoms and signs involving the musculoskeletal system  Muscle weakness (generalized)  Other lack of coordination  Stiffness of finger joint of right hand  Rationale for Evaluation and Treatment: Rehabilitation  SUBJECTIVE:   SUBJECTIVE STATEMENT: Pt reporting he is doing fairly better since eval in August, but has some stiffness in right thumb.  Pt reports [facility] his [facility].  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 Games have already begun, pt would like to get back eventually. Reports pins have since been removed.  Pt accompanied by: self  PERTINENT HISTORY: PMHx: Asthma and eczema    Date of surgery 06/07/24 - right thumb closed reduction percutaneous pinning of  proximal phalanx    PRECAUTIONS: None   RED FLAGS: None       WEIGHT BEARING RESTRICTIONS: No   PAIN:  Are you having pain? No   FALLS: Has patient fallen in last 6 months? 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   HAND DOMINANCE: Right   ADLs: [facility] - modify with L hand   FUNCTIONAL OUTCOME MEASURES: Quick Dash: 47.7 at initial eval on 07/05/24 09/25/24: 6.8   UPPER EXTREMITY ROM:      Active ROM Left eval Right eval  Shoulder flexion      Shoulder abduction      Shoulder adduction      Shoulder extension      Shoulder internal rotation      Shoulder external rotation      Elbow flexion      Elbow extension      Wrist flexion 70 88  Wrist extension 75 85  Wrist ulnar deviation      Wrist radial deviation      Wrist pronation      Wrist supination      (  Blank rows = not tested)   Active ROM Right eval Left eval  Thumb MCP (0-60)      Thumb IP (0-80) 10 WNL  Thumb Radial abd/add (0-55)      Thumb Palmar abd/add (0-45) 45  50  Thumb Opposition to Small Finger tip  tip  Index MCP (0-90)      Index PIP (0-100)      Index DIP (0-70)      Long MCP (0-90)      Long PIP (0-100)      Long DIP (0-70)      Ring MCP (0-90)      Ring PIP (0-100)      Ring DIP (0-70)      Little MCP (0-90)      Little PIP (0-100)      Little DIP (0-70)      (Blank rows = not tested)     UPPER EXTREMITY MMT:      MMT Right eval Left eval  Shoulder flexion 5 5  Shoulder abduction      Shoulder adduction      Shoulder extension      Shoulder internal rotation      Shoulder external rotation      Middle trapezius       Lower trapezius      Elbow flexion 5 5  Elbow extension 5 5  Wrist flexion      Wrist extension      Wrist ulnar deviation      Wrist radial deviation      Wrist pronation      Wrist supination      (Blank rows = not tested)   HAND FUNCTION: Grip strength: Right: 106.7 lbs; Left: 93.6 lbs    COORDINATION: 9HPT: Right: 23.34 seconds; Left: 27.06 seconds  Box and Blocks: 60 R hand; 65 L hand    SENSATION: WFL   EDEMA: Circumferential measurement of L thumb prox phal 8 cm and R thumb 8 cm   COGNITION: Overall cognitive status: Within functional limits for tasks assessed     TREATMENT DATE: 09/25/24                                                                                                                            No tx billed d/t payer source this date. Pt educated in purpose of OT, POC, and goals, with pt in agreement. Pt issued stretches to improve ROM in thumb MP and IP joints. Educated pt in benefits of heat modality for loosening up stiff thumb (pt did confirm the pins have been removed at this time) and safety precautions for heat. Min verbal cues for proper placement of fingers to reduce risk of injury. See Pt instructions for detailed handout.      PATIENT EDUCATION: Education details: SEE ABOVE Person educated: Patient Education method: Explanation, Demonstration, Verbal cues, and Handouts Education comprehension: verbalized understanding, returned demonstration, verbal cues required, and needs further  education  HOME EXERCISE PROGRAM: 09/25/24: THUMB PROM Access Code: [email or web address] URL: [email or web address] Date: 09/25/2024 Prepared by: Rocky Dutch  Exercises - Thumb MP Flexion Extension  - 1 x daily - 7 x weekly - 3 sets - 10 reps - 3-5 hold - Seated Thumb IP Flexion PROM  - 1 x daily - 7 x weekly - 3 sets - 10 reps - 3-5 hold - Seated Composite Thumb Flexion PROM  - 1 x daily - 7 x weekly - 3 sets - 10 reps - 3-5 hold - Thumb PROM  Abduction  - 1 x daily - 7 x weekly - 3 sets - 10 reps - 3-5 hold  GOALS: Goals reviewed with patient? Yes  SHORT TERM GOALS: Target date: 09/25/24  Patient will demonstrate initial RUE HEP with 25% verbal cues or less for proper execution. Baseline: New to outpt OT Goal status: IN Progress - Exc issued at eval  2.  Pt will demonstrate improved function of R hand by scoring at least 65 blocks on Box and Blocks Test  Baseline: Right:60 blocks, Left: 65 Goal status: INITIAL  3.  Pt will increase IP thumb flexion ROM by 10 degrees Baseline: 10 degrees Goal status: INITIAL    LONG TERM GOALS: Target date: 11/25/24  Patient will demonstrate updated RUE HEP with visual instruction only for proper execution. Baseline: New to outpt OT Goal status: IN Progress- Initial stretches issued at eval  2.  Pt will increase R IP flexion to at least 60 degrees Baseline: 10 degrees Goal status: INITIAL  ASSESSMENT:  CLINICAL IMPRESSION: Patient is a 18 y.o. male who was seen today for occupational therapy evaluation for Closed displaced fracture of proximal phalanx of right thumb . Hx includes asthma and eczema. Patient currently presents slightly below baseline level of functioning demonstrating functional deficits and impairments as noted below. Pt would benefit from skilled OT services in the outpatient setting to work on impairments as noted below to help pt return to PLOF as able.     PERFORMANCE DEFICITS: in functional skills including coordination, dexterity, ROM, pain, and UE functional use,  and psychosocial skills including coping strategies, environmental adaptation, and routines and behaviors.   IMPAIRMENTS: are limiting patient from education, leisure, and social participation.   COMORBIDITIES: may have co-morbidities  that affects occupational performance. Patient will benefit from skilled OT to address above impairments and improve overall function.  MODIFICATION OR ASSISTANCE TO  COMPLETE EVALUATION: Min-Moderate modification of tasks or assist with assess necessary to complete an evaluation.  OT OCCUPATIONAL PROFILE AND HISTORY: Detailed assessment: Review of records and additional review of physical, cognitive, psychosocial history related to current functional performance.  CLINICAL DECISION MAKING: Moderate - several treatment options, min-mod task modification necessary  REHAB POTENTIAL: Fair as evidenced by motivated to participate, however has hx of no shows/cancellation  EVALUATION COMPLEXITY: Moderate      PLAN:  OT FREQUENCY: 1x/week  OT DURATION: 8 weeks  PLANNED INTERVENTIONS: 97168 OT Re-evaluation, 97535 self care/ADL training, 02889 therapeutic exercise, 97530 therapeutic activity, 97140 manual therapy, 97035 ultrasound, 97018 paraffin, 02960 fluidotherapy, 97010 moist heat, manual lymph drainage, passive range of motion, energy conservation, coping strategies training, patient/family education, and DME and/or AE instructions  RECOMMENDED OTHER SERVICES: none  CONSULTED AND AGREED WITH PLAN OF CARE: Patient  PLAN FOR NEXT SESSION: fluidotherapy For all possible CPT codes, reference the Planned Interventions line above.     Check all conditions that are expected to impact treatment: {Conditions expected to impact  treatment:None of these apply   If treatment provided at initial evaluation, no treatment charged due to lack of authorization.        Rocky Dutch, OT 09/25/2024, 2:00 PM

## 2024-09-25 ENCOUNTER — Ambulatory Visit: Attending: Orthopedic Surgery

## 2024-09-25 DIAGNOSIS — R278 Other lack of coordination: Secondary | ICD-10-CM | POA: Diagnosis not present

## 2024-09-25 DIAGNOSIS — M6281 Muscle weakness (generalized): Secondary | ICD-10-CM | POA: Diagnosis not present

## 2024-09-25 DIAGNOSIS — S62511A Displaced fracture of proximal phalanx of right thumb, initial encounter for closed fracture: Secondary | ICD-10-CM | POA: Insufficient documentation

## 2024-09-25 DIAGNOSIS — M25641 Stiffness of right hand, not elsewhere classified: Secondary | ICD-10-CM | POA: Diagnosis not present

## 2024-09-25 DIAGNOSIS — R29898 Other symptoms and signs involving the musculoskeletal system: Secondary | ICD-10-CM | POA: Insufficient documentation

## 2024-10-09 ENCOUNTER — Ambulatory Visit

## 2024-10-11 ENCOUNTER — Telehealth: Payer: Self-pay

## 2024-10-11 NOTE — Telephone Encounter (Signed)
 Pt no-showed for OT appointment today. Therefore, OT called pt's listed phone number [phone]). OT reminded pt of next OT appointment date/time, provided education on cancellation/no-show policy, recommended to pt to call clinic office if pt is unable to make appointments, and provided contact information of clinic.  Rocky Dutch, OTR/L

## 2024-10-12 ENCOUNTER — Ambulatory Visit

## 2024-10-12 DIAGNOSIS — M25641 Stiffness of right hand, not elsewhere classified: Secondary | ICD-10-CM | POA: Diagnosis not present

## 2024-10-12 DIAGNOSIS — R29898 Other symptoms and signs involving the musculoskeletal system: Secondary | ICD-10-CM

## 2024-10-12 DIAGNOSIS — M6281 Muscle weakness (generalized): Secondary | ICD-10-CM

## 2024-10-12 DIAGNOSIS — S62511A Displaced fracture of proximal phalanx of right thumb, initial encounter for closed fracture: Secondary | ICD-10-CM | POA: Diagnosis not present

## 2024-10-12 DIAGNOSIS — R278 Other lack of coordination: Secondary | ICD-10-CM | POA: Diagnosis not present

## 2024-10-12 NOTE — Therapy (Signed)
 OUTPATIENT OCCUPATIONAL THERAPY ORTHO TREATMENT  Patient Name: Jesse Hart MRN: [id number] DOB:04/28/2006, 18 y.o., male Today's Date: 10/12/2024  PCP: Lonnie Earnest, MD REFERRING PROVIDER: Brutus Boast, MD  END OF SESSION:  OT End of Session - 10/12/24 1544     Visit Number 2    Number of Visits 9    Date for Recertification  10/27/24   at time of eval, pt has missed tx session   Authorization Type [employer] 2025 $4 Copay    OT Start Time 1405    OT Stop Time 1445    OT Time Calculation (min) 40 min    Equipment Utilized During Treatment leukotape and cover roll, fluidotherapy    Activity Tolerance Patient tolerated treatment well    Behavior During Therapy WFL for tasks assessed/performed           Past Medical History:  Diagnosis Date   Asthma    No past surgical history on file. Patient Active Problem List   Diagnosis Date Noted   Elevated blood pressure reading 06/13/2024   Grade 3 ankle sprain, left, initial encounter 12/04/2019   Eczema 01/19/2007    ONSET DATE: 09/13/24 referral date, 06/07/24 DOS  REFERRING DIAG: D37.488J (ICD-10-CM) - Closed displaced fracture of proximal phalanx of right thumb, initial encounter  THERAPY DIAG:  Stiffness of finger joint of right hand  Other symptoms and signs involving the musculoskeletal system  Muscle weakness (generalized)  Rationale for Evaluation and Treatment: Rehabilitation  SUBJECTIVE:   SUBJECTIVE STATEMENT: Pt reporting he is doing well since eval, requesting paper if treating therapist feels like he could return to playing basketball Pt accompanied by: self  PERTINENT HISTORY: PMHx: Asthma and eczema    Date of surgery 06/07/24 - right thumb closed reduction percutaneous pinning of proximal phalanx    PRECAUTIONS: None   RED FLAGS: None       WEIGHT BEARING RESTRICTIONS: No   PAIN:  Are you having pain? No   FALLS: Has patient fallen in last 6 months? 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   HAND DOMINANCE: Right   ADLs: [facility] - modify with L hand   FUNCTIONAL OUTCOME MEASURES: Quick Dash: 47.7 at initial eval on 07/05/24 09/25/24: 6.8   UPPER EXTREMITY ROM:      Active ROM Left eval Right eval  Shoulder flexion      Shoulder abduction      Shoulder adduction      Shoulder extension      Shoulder internal rotation      Shoulder external rotation      Elbow flexion      Elbow extension      Wrist flexion 70 88  Wrist extension 75 85  Wrist ulnar deviation      Wrist radial deviation      Wrist pronation      Wrist supination      (Blank rows = not tested)   Active ROM Right eval Left eval  Thumb MCP (0-60)  WNL    Thumb IP (0-80) 10 WNL  Thumb Radial abd/add (0-55)      Thumb  Palmar abd/add (0-45) 45  50  Thumb Opposition to Small Finger tip  tip  Index MCP (0-90)      Index PIP (0-100)      Index DIP (0-70)      Long MCP (0-90)      Long PIP (0-100)      Long DIP (0-70)      Ring MCP (0-90)      Ring PIP (0-100)      Ring DIP (0-70)      Little MCP (0-90)      Little PIP (0-100)      Little DIP (0-70)      (Blank rows = not tested)     UPPER EXTREMITY MMT:      MMT Right eval Left eval  Shoulder flexion 5 5  Shoulder abduction      Shoulder adduction      Shoulder extension      Shoulder internal rotation      Shoulder external rotation      Middle trapezius      Lower trapezius      Elbow flexion 5 5  Elbow extension 5 5  Wrist flexion      Wrist extension      Wrist ulnar deviation      Wrist radial deviation      Wrist pronation      Wrist  supination      (Blank rows = not tested)   HAND FUNCTION: Grip strength: Right: 106.7 lbs; Left: 93.6 lbs    COORDINATION: 9HPT: Right: 23.34 seconds; Left: 27.06 seconds  Box and Blocks: 60 R hand; 65 L hand    SENSATION: WFL   EDEMA: Circumferential measurement of L thumb prox phal 8 cm and R thumb 8 cm   COGNITION: Overall cognitive status: Within functional limits for tasks assessed     TREATMENT DATE: 10/12/24                                                                                                                            Re-took measurements of MP and IP flexion. Pt educated in benefits of fluidotherapy to reduce stiffness in thumb joint, pt agreed. Pt placed RUE in Fluidotherapy machine with supervised ROM x 10 min. Pt was educated to complete thumb/hand AROM during modality time to improve ROM and decrease pain/stiffness of affected extremity by use of the machine's massaging action and thermal properties.    Pt asked therapist if he could return to playing basketball if he taped his thumb and continued to participate in therapy. Referred to IHP pg [phone] and did not see any contraindications to this, so informed pt he could. Messaged orthopedic MD who performed pinning for second opinion, who agreed. Instructed pt in taping with cover roll and leukotape for improved comfort and protection, applied just above MP joint of thumb. Pt verbalized understanding of how to apply himself and agreed to remove either after 3 days  and allow at least 1 day for skin to breathe or if tape does not assist as intendd.      PATIENT EDUCATION: Education details: SEE ABOVE Person educated: Patient Education method: Programmer, Multimedia, Demonstration, Verbal cues, and Handouts Education comprehension: verbalized understanding, returned demonstration, verbal cues required, and needs further education  HOME EXERCISE PROGRAM: 09/25/24: THUMB PROM Access Code: [email or web address] URL:  [email or web address] Date: 09/25/2024 Prepared by: Rocky Dutch  Exercises - Thumb MP Flexion Extension  - 1 x daily - 7 x weekly - 3 sets - 10 reps - 3-5 hold - Seated Thumb IP Flexion PROM  - 1 x daily - 7 x weekly - 3 sets - 10 reps - 3-5 hold - Seated Composite Thumb Flexion PROM  - 1 x daily - 7 x weekly - 3 sets - 10 reps - 3-5 hold - Thumb PROM Abduction  - 1 x daily - 7 x weekly - 3 sets - 10 reps - 3-5 hold  GOALS: Goals reviewed with patient? Yes  SHORT TERM GOALS: Target date: 09/25/24  Patient will demonstrate initial RUE HEP with 25% verbal cues or less for proper execution. Baseline: New to outpt OT Goal status: IN Progress - Exc issued at eval  2.  Pt will demonstrate improved function of R hand by scoring at least 65 blocks on Box and Blocks Test  Baseline: Right:60 blocks, Left: 65 Goal status: INITIAL  3.  Pt will increase IP thumb flexion ROM by 10 degrees Baseline: 10 degrees 10/12/24: 45 degrees Goal status: MET    LONG TERM GOALS: Target date: 11/25/24  Patient will demonstrate updated RUE HEP with visual instruction only for proper execution. Baseline: New to outpt OT Goal status: IN Progress- Initial stretches issued at eval  2.  Pt will increase R IP flexion to at least 60 degrees Baseline: 10 degrees 10/12/24: 45 degrees Goal status: IN PROGRESS  ASSESSMENT:  CLINICAL IMPRESSION: Patient is a 18 y.o. male who was seen today for occupational therapy evaluation for Closed displaced fracture of proximal phalanx of right thumb . Hx includes asthma and eczema. Patient currently presents slightly below baseline level of functioning demonstrating functional deficits and impairments as noted below. Pt would benefit from skilled OT services in the outpatient setting to work on impairments as noted below to help pt return to PLOF as able.     PERFORMANCE DEFICITS: in functional skills including coordination, dexterity, ROM, pain, and UE  functional use,  and psychosocial skills including coping strategies, environmental adaptation, and routines and behaviors.   IMPAIRMENTS: are limiting patient from education, leisure, and social participation.   COMORBIDITIES: may have co-morbidities  that affects occupational performance. Patient will benefit from skilled OT to address above impairments and improve overall function.  MODIFICATION OR ASSISTANCE TO COMPLETE EVALUATION: Min-Moderate modification of tasks or assist with assess necessary to complete an evaluation.  OT OCCUPATIONAL PROFILE AND HISTORY: Detailed assessment: Review of records and additional review of physical, cognitive, psychosocial history related to current functional performance.  CLINICAL DECISION MAKING: Moderate - several treatment options, min-mod task modification necessary  REHAB POTENTIAL: Fair as evidenced by motivated to participate, however has hx of no shows/cancellation  EVALUATION COMPLEXITY: Moderate      PLAN:  OT FREQUENCY: 1x/week  OT DURATION: 8 weeks  PLANNED INTERVENTIONS: 97168 OT Re-evaluation, 97535 self care/ADL training, 02889 therapeutic exercise, 97530 therapeutic activity, 97140 manual therapy, 97035 ultrasound, 97018 paraffin, 02960 fluidotherapy, 97010 moist heat, manual lymph drainage, passive range of motion, energy  conservation, coping strategies training, patient/family education, and DME and/or AE instructions  RECOMMENDED OTHER SERVICES: none  CONSULTED AND AGREED WITH PLAN OF CARE: Patient  PLAN FOR NEXT SESSION: fluidotherapy F/u taping Strengthening of thumb (putty and/or rubber band) For all possible CPT codes, reference the Planned Interventions line above.     Check all conditions that are expected to impact treatment: {Conditions expected to impact treatment:None of these apply   If treatment provided at initial evaluation, no treatment charged due to lack of authorization.        Rocky Dutch,  OT 10/12/2024, 3:45 PM

## 2024-10-23 ENCOUNTER — Ambulatory Visit: Attending: Orthopedic Surgery

## 2024-10-23 ENCOUNTER — Telehealth: Payer: Self-pay

## 2024-10-23 NOTE — Telephone Encounter (Signed)
 Pt no-showed for OT appointment today. Therefore, OT called pt's listed phone number [phone]). OT was able to reach pt, reminding pt of next OT appointment date/time. May discharge next week d/t progress made thus far.  Jesse Hart, OTR/L

## 2024-10-30 ENCOUNTER — Telehealth: Payer: Self-pay

## 2024-10-30 ENCOUNTER — Ambulatory Visit

## 2024-10-30 NOTE — Telephone Encounter (Signed)
 Pt no-showed for OT appointment today. Therefore, OT called pt's listed phone number [phone]). This is patient's third no show, informed via voicemail that discharge will be completed per attendance policy. Pt also instructed via voicemail to continue to tape affected digit for protection in sports.  Rocky Dutch, OTR/L

## 2024-10-30 NOTE — Therapy (Unsigned)
 [facility] [address] [place], [place], [address] Phone: [phone]   Fax:  [phone]  Patient Details  Name: Jesse Hart MRN: [id number] Date of Birth: 11-18-2006 Referring Provider:  No ref. provider found  Encounter Date: 10/30/2024  OCCUPATIONAL THERAPY DISCHARGE SUMMARY  Visits from Start of Care: Including eval pt received 2 visits from 09/25/24-10/30/24.   Current functional level related to goals / functional outcomes: Pet met 1/3 STGs and 0/2 LTGs   Remaining deficits: Limited ROM in R thumb   Education / Equipment: Educated in taping for protection of R thumb during participation in sports, HEP for improving ROM of thumb   Patient agrees to discharge. Patient goals were partially met. Patient is being discharged due to no-show 3 times.SABRA Rocky Dutch, OT 10/30/2024, 12:53 PM  [facility] [address] [place], [place], [address] Phone: [phone]   Fax:  [phone]

## 2024-11-06 ENCOUNTER — Ambulatory Visit: Admitting: Occupational Therapy

## 2024-11-13 ENCOUNTER — Ambulatory Visit

## 2024-11-20 ENCOUNTER — Ambulatory Visit

## 2024-11-23 ENCOUNTER — Encounter (HOSPITAL_COMMUNITY): Payer: Self-pay

## 2024-11-23 ENCOUNTER — Ambulatory Visit (HOSPITAL_COMMUNITY)
Admission: EM | Admit: 2024-11-23 | Discharge: 2024-11-23 | Disposition: A | Discharge status: Home / Self Care | Attending: Nurse Practitioner | Admitting: Nurse Practitioner

## 2024-11-23 DIAGNOSIS — J4521 Mild intermittent asthma with (acute) exacerbation: Secondary | ICD-10-CM | POA: Diagnosis not present

## 2024-11-23 MED ORDER — VENTOLIN HFA 108 (90 BASE) MCG/ACT IN AERS: 1.0000 | INHALATION_SPRAY | RESPIRATORY_TRACT | 0 refills | Status: AC | PRN

## 2024-11-23 NOTE — ED Triage Notes (Signed)
 PT would like a refill on his Albuterol  inhaler. Patient states he was walking out and became SOB.

## 2024-11-23 NOTE — ED Provider Notes (Signed)
 " [facility]    CSN: [id number] Arrival date & time: 11/23/24  9060      History   Chief Complaint No chief complaint on file.   HPI Jesse Hart is a 19 y.o. male.   Discussed the use of AI scribe software for clinical note transcription with the patient, who gave verbal consent to proceed.   Patient presents for albuterol  inhaler refill and evaluation of respiratory symptoms. The patient reports being sick recently and then working out in a cold gym after basketball. On yesterday, the patient experienced an uncomfortable feeling in the chest that was described as tightness, which had never happened before. The patient developed a cough after the workout and continues to cough intermittently, though not actively coughing while at rest during the visit. The patient reports shortness of breath and wheezing when working out. The patient has a history of asthma but states this is the first time being significantly affected by symptoms. The patient denies fevers, chills, body aches, runny nose, sore throat, headache, vomiting, and diarrhea. The patient denies smoking or vaping.  The following sections of the patient's history were reviewed and updated as appropriate: allergies, current medications, past family history, past medical history, past social history, past surgical history, and problem list.     Past Medical History:  Diagnosis Date   Asthma     Patient Active Problem List   Diagnosis Date Noted   Elevated blood pressure reading 06/13/2024   Grade 3 ankle sprain, left, initial encounter 12/04/2019   Eczema 01/19/2007    History reviewed. No pertinent surgical history.     Home Medications    Prior to Admission medications  Medication Sig Start Date End Date Taking? Authorizing Provider  albuterol  (VENTOLIN  HFA) 108 (90 Base) MCG/ACT inhaler Inhale 1-2 puffs into the lungs every 4 (four) hours as needed for wheezing or shortness of breath. 11/23/24  Yes  Monica Zahler, FNP  hydrOXYzine  (ATARAX ) 25 MG tablet Take 1 tablet (25 mg total) by mouth 3 (three) times daily as needed for anxiety. 07/06/24   Nicholas Bar, MD    Family History History reviewed. No pertinent family history.  Social History Social History[1]   Allergies   Patient has no known allergies.   Review of Systems Review of Systems  Constitutional:  Negative for fever.  HENT:  Positive for rhinorrhea. Negative for congestion, sneezing and sore throat.   Respiratory:  Positive for cough, chest tightness, shortness of breath and wheezing.   Gastrointestinal:  Negative for diarrhea, nausea and vomiting.  Neurological:  Negative for headaches.  All other systems reviewed and are negative.    Physical Exam Triage Vital Signs ED Triage Vitals [11/23/24 1145]  Encounter Vitals Group     BP (!) 173/99     Girls Systolic BP Percentile      Girls Diastolic BP Percentile      Boys Systolic BP Percentile      Boys Diastolic BP Percentile      Pulse Rate 82     Resp 18     Temp 98.2 F (36.8 C)     Temp Source Oral     SpO2 98 %     Weight      Height      Head Circumference      Peak Flow      Pain Score      Pain Loc      Pain Education      Exclude  from Growth Chart    No data found.  Updated Vital Signs BP (!) 173/99 (BP Location: Left Arm)   Pulse 82   Temp 98.2 F (36.8 C) (Oral)   Resp 18   SpO2 98%   Visual Acuity Right Eye Distance:   Left Eye Distance:   Bilateral Distance:    Right Eye Near:   Left Eye Near:    Bilateral Near:     Physical Exam Vitals reviewed.  Constitutional:      General: He is awake. He is not in acute distress.    Appearance: Normal appearance. He is well-developed. He is not ill-appearing, toxic-appearing or diaphoretic.  HENT:     Head: Normocephalic.     Right Ear: Hearing, tympanic membrane, ear canal and external ear normal. No drainage, swelling or tenderness. No middle ear effusion. Tympanic  membrane is not erythematous.     Left Ear: Hearing, tympanic membrane, ear canal and external ear normal. No drainage, swelling or tenderness.  No middle ear effusion. Tympanic membrane is not erythematous.     Mouth/Throat:     Lips: Pink.     Mouth: Mucous membranes are moist.     Pharynx: Uvula midline. No pharyngeal swelling, oropharyngeal exudate, posterior oropharyngeal erythema or uvula swelling.     Tonsils: No tonsillar exudate or tonsillar abscesses.  Eyes:     General: Vision grossly intact.     Conjunctiva/sclera: Conjunctivae normal.  Cardiovascular:     Rate and Rhythm: Normal rate and regular rhythm.     Heart sounds: Normal heart sounds.  Pulmonary:     Effort: Pulmonary effort is normal. No tachypnea or respiratory distress.     Breath sounds: Normal breath sounds and air entry. No decreased breath sounds or wheezing.     Comments: Respirations even and unlabored  Musculoskeletal:        General: Normal range of motion.     Cervical back: Full passive range of motion without pain, normal range of motion and neck supple.  Lymphadenopathy:     Cervical: No cervical adenopathy.  Skin:    General: Skin is warm and dry.  Neurological:     General: No focal deficit present.     Mental Status: He is alert and oriented to person, place, and time.  Psychiatric:        Speech: Speech normal.        Behavior: Behavior is cooperative.      UC Treatments / Results  Labs (all labs ordered are listed, but only abnormal results are displayed) Labs Reviewed - No data to display  EKG   Radiology No results found.  Procedures Procedures (including critical care time)  Medications Ordered in UC Medications - No data to display  Initial Impression / Assessment and Plan / UC Course  I have reviewed the triage vital signs and the nursing notes.  Pertinent labs & imaging results that were available during my care of the patient were reviewed by me and considered in my  medical decision making (see chart for details).     The patient presents with chest tightness and intermittent cough that began after exercising in a cold gym following a recent illness. Symptoms started yesterday after basketball practice and cold exposure and include intermittent cough with a history of exercise-associated shortness of breath and wheezing. The patient has a known history of asthma. On evaluation, the patient is afebrile and nontoxic in appearance. Lung examination reveals clear breath sounds throughout with  even and unlabored respirations and no signs of acute respiratory distress. Clinical presentation is most consistent with exercise- and cold-induced bronchospasm in the setting of underlying asthma, without evidence of acute infection or asthma exacerbation requiring escalation of care at this time.  An albuterol  inhaler refill was provided for rescue use, and supportive care was recommended. The patient was advised to avoid cold-air exposure when possible, use the inhaler prior to exercise as directed, and monitor symptoms closely. Follow-up with the primary care provider or asthma specialist was recommended for ongoing asthma management and to assess the need for controller therapy if symptoms persist or increase in frequency. Emergency care precautions were reviewed, including seeking immediate evaluation for worsening chest tightness, persistent shortness of breath not relieved by albuterol , increased wheezing, chest pain, cyanosis, dizziness, or signs of respiratory distress.  Today's evaluation has revealed no signs of a dangerous process. Discussed diagnosis with patient and/or guardian. Patient and/or guardian aware of their diagnosis, possible red flag symptoms to watch out for and need for close follow up. Patient and/or guardian understands verbal and written discharge instructions. Patient and/or guardian comfortable with plan and disposition.  Patient and/or guardian has a  clear mental status at this time, good insight into illness (after discussion and teaching) and has clear judgment to make decisions regarding their care  Documentation was completed with the aid of voice recognition software. Transcription may contain typographical errors.   Final Clinical Impressions(s) / UC Diagnoses   Final diagnoses:  Mild intermittent asthma with acute exacerbation     Discharge Instructions      You were seen today for chest tightness, shortness of breath, wheezing and coughing that started after exercising in a cold environment. This appears related to your asthma and likely triggered by cold air and recent illness rather than an infection. Your lung exam was normal today, and you are not showing signs of a serious asthma flare at this time.  Use your albuterol  inhaler as prescribed when you have chest tightness, coughing, wheezing, or shortness of breath. You may also use it before exercise if cold air or activity usually triggers symptoms. Try to avoid exercising in very cold environments when possible, warm up gradually, and consider covering your mouth and nose in cold air to help reduce symptoms. Rest, stay well hydrated, and monitor how often you need your inhaler.  Follow up with your primary care provider or an asthma specialist if symptoms continue, become more frequent, wake you at night, or if you are needing your rescue inhaler more often than usual, as you may need adjustments to your asthma treatment plan. Go to the emergency department right away if you develop severe or worsening shortness of breath, chest tightness that does not improve with your inhaler, blue or gray lips or fingertips, dizziness, trouble speaking in full sentences, or any signs of respiratory distress.      ED Prescriptions     Medication Sig Dispense Auth. Provider   albuterol  (VENTOLIN  HFA) 108 (90 Base) MCG/ACT inhaler Inhale 1-2 puffs into the lungs every 4 (four) hours as  needed for wheezing or shortness of breath. 18 g Iola Lukes, FNP      PDMP not reviewed this encounter.     [1]  Social History Tobacco Use   Smoking status: Never    Passive exposure: Past   Smokeless tobacco: Never  Vaping Use   Vaping status: Never Used  Substance Use Topics   Alcohol use: Never   Drug use:  Never     Iola Lukes, [place] 11/23/24 1222  "

## 2024-11-23 NOTE — Discharge Instructions (Addendum)
 You were seen today for chest tightness, shortness of breath, wheezing and coughing that started after exercising in a cold environment. This appears related to your asthma and likely triggered by cold air and recent illness rather than an infection. Your lung exam was normal today, and you are not showing signs of a serious asthma flare at this time.  Use your albuterol  inhaler as prescribed when you have chest tightness, coughing, wheezing, or shortness of breath. You may also use it before exercise if cold air or activity usually triggers symptoms. Try to avoid exercising in very cold environments when possible, warm up gradually, and consider covering your mouth and nose in cold air to help reduce symptoms. Rest, stay well hydrated, and monitor how often you need your inhaler.  Follow up with your primary care provider or an asthma specialist if symptoms continue, become more frequent, wake you at night, or if you are needing your rescue inhaler more often than usual, as you may need adjustments to your asthma treatment plan. Go to the emergency department right away if you develop severe or worsening shortness of breath, chest tightness that does not improve with your inhaler, blue or gray lips or fingertips, dizziness, trouble speaking in full sentences, or any signs of respiratory distress.
